# Patient Record
Sex: Female | Born: 1985 | Hispanic: No | Marital: Single | Smoking: Never smoker
Health system: Southern US, Community
[De-identification: ages and names within clinical notes are randomized; demographics above are authoritative.]

## PROBLEM LIST (undated history)

## (undated) ENCOUNTER — Inpatient Hospital Stay (HOSPITAL_COMMUNITY): Payer: Self-pay

## (undated) DIAGNOSIS — R87629 Unspecified abnormal cytological findings in specimens from vagina: Secondary | ICD-10-CM

## (undated) DIAGNOSIS — Z789 Other specified health status: Secondary | ICD-10-CM

## (undated) HISTORY — PX: WISDOM TOOTH EXTRACTION: SHX21

---
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
## 2006-09-25 ENCOUNTER — Ambulatory Visit: Payer: Self-pay | Admitting: Family Medicine

## 2006-09-26 ENCOUNTER — Ambulatory Visit: Payer: Self-pay | Admitting: Family Medicine

## 2006-12-19 ENCOUNTER — Ambulatory Visit: Payer: Self-pay | Admitting: Family Medicine

## 2007-03-29 ENCOUNTER — Ambulatory Visit: Payer: Self-pay | Admitting: Family Medicine

## 2007-03-30 ENCOUNTER — Ambulatory Visit: Payer: Self-pay | Admitting: Internal Medicine

## 2007-06-04 ENCOUNTER — Encounter: Payer: Self-pay | Admitting: Family Medicine

## 2007-06-04 DIAGNOSIS — E669 Obesity, unspecified: Secondary | ICD-10-CM

## 2007-06-21 ENCOUNTER — Telehealth (INDEPENDENT_AMBULATORY_CARE_PROVIDER_SITE_OTHER): Payer: Self-pay | Admitting: Family Medicine

## 2007-06-21 ENCOUNTER — Ambulatory Visit: Payer: Self-pay | Admitting: Family Medicine

## 2007-12-03 ENCOUNTER — Emergency Department (HOSPITAL_COMMUNITY): Admission: EM | Admit: 2007-12-03 | Discharge: 2007-12-03 | Payer: Self-pay | Admitting: Emergency Medicine

## 2008-06-10 ENCOUNTER — Other Ambulatory Visit: Admission: RE | Admit: 2008-06-10 | Discharge: 2008-06-10 | Payer: Self-pay | Admitting: Family Medicine

## 2008-06-10 ENCOUNTER — Ambulatory Visit: Payer: Self-pay | Admitting: Family Medicine

## 2008-06-10 ENCOUNTER — Encounter (INDEPENDENT_AMBULATORY_CARE_PROVIDER_SITE_OTHER): Payer: Self-pay | Admitting: Family Medicine

## 2008-06-10 LAB — CONVERTED CEMR LAB
Bilirubin Urine: NEGATIVE
Blood in Urine, dipstick: NEGATIVE
Glucose, Urine, Semiquant: NEGATIVE
Ketones, urine, test strip: NEGATIVE
Nitrite: NEGATIVE
Protein, U semiquant: NEGATIVE
Specific Gravity, Urine: >=1.03
Urobilinogen, UA: 4
WBC Urine, dipstick: NEGATIVE
pH: 5

## 2008-06-11 ENCOUNTER — Encounter (INDEPENDENT_AMBULATORY_CARE_PROVIDER_SITE_OTHER): Payer: Self-pay | Admitting: Family Medicine

## 2008-06-11 LAB — CONVERTED CEMR LAB
Chlamydia, DNA Probe: NEGATIVE
GC Probe Amp, Genital: NEGATIVE

## 2008-06-18 ENCOUNTER — Telehealth (INDEPENDENT_AMBULATORY_CARE_PROVIDER_SITE_OTHER): Payer: Self-pay | Admitting: *Deleted

## 2008-06-29 ENCOUNTER — Encounter (INDEPENDENT_AMBULATORY_CARE_PROVIDER_SITE_OTHER): Payer: Self-pay | Admitting: Family Medicine

## 2008-10-31 ENCOUNTER — Emergency Department (HOSPITAL_COMMUNITY): Admission: EM | Admit: 2008-10-31 | Discharge: 2008-10-31 | Payer: Self-pay | Admitting: Emergency Medicine

## 2008-11-13 ENCOUNTER — Emergency Department (HOSPITAL_COMMUNITY): Admission: EM | Admit: 2008-11-13 | Discharge: 2008-11-13 | Payer: Self-pay | Admitting: *Deleted

## 2009-06-07 ENCOUNTER — Inpatient Hospital Stay (HOSPITAL_COMMUNITY): Admission: AD | Admit: 2009-06-07 | Discharge: 2009-06-12 | Payer: Self-pay | Admitting: Obstetrics and Gynecology

## 2009-07-29 ENCOUNTER — Emergency Department (HOSPITAL_COMMUNITY): Admission: EM | Admit: 2009-07-29 | Discharge: 2009-07-30 | Payer: Self-pay | Admitting: Emergency Medicine

## 2009-10-28 ENCOUNTER — Emergency Department (HOSPITAL_COMMUNITY): Admission: EM | Admit: 2009-10-28 | Discharge: 2009-10-28 | Payer: Self-pay | Admitting: Family Medicine

## 2009-12-08 ENCOUNTER — Emergency Department (HOSPITAL_COMMUNITY): Admission: EM | Admit: 2009-12-08 | Discharge: 2009-12-08 | Payer: Self-pay | Admitting: Emergency Medicine

## 2009-12-22 ENCOUNTER — Emergency Department (HOSPITAL_COMMUNITY): Admission: EM | Admit: 2009-12-22 | Discharge: 2009-12-22 | Payer: Self-pay | Admitting: Emergency Medicine

## 2010-10-19 ENCOUNTER — Emergency Department (HOSPITAL_COMMUNITY)
Admission: EM | Admit: 2010-10-19 | Discharge: 2010-10-19 | Payer: Self-pay | Source: Home / Self Care | Admitting: Emergency Medicine

## 2010-10-25 LAB — CBC
HCT: 36.5 % (ref 36.0–46.0)
Hemoglobin: 12.6 g/dL (ref 12.0–15.0)
MCH: 31.2 pg (ref 26.0–34.0)
MCHC: 34.5 g/dL (ref 30.0–36.0)
MCV: 90.3 fL (ref 78.0–100.0)
Platelets: 257 10*3/uL (ref 150–400)
RBC: 4.04 MIL/uL (ref 3.87–5.11)
RDW: 13.7 % (ref 11.5–15.5)
WBC: 9 10*3/uL (ref 4.0–10.5)

## 2010-10-25 LAB — DIFFERENTIAL
Basophils Absolute: 0 10*3/uL (ref 0.0–0.1)
Basophils Relative: 0 % (ref 0–1)
Eosinophils Absolute: 0.1 10*3/uL (ref 0.0–0.7)
Eosinophils Relative: 1 % (ref 0–5)
Lymphocytes Relative: 20 % (ref 12–46)
Lymphs Abs: 1.8 10*3/uL (ref 0.7–4.0)
Monocytes Absolute: 0.5 10*3/uL (ref 0.1–1.0)
Monocytes Relative: 5 % (ref 3–12)
Neutro Abs: 6.7 10*3/uL (ref 1.7–7.7)
Neutrophils Relative %: 74 % (ref 43–77)

## 2010-10-25 LAB — COMPREHENSIVE METABOLIC PANEL
ALT: 11 U/L (ref 0–35)
AST: 15 U/L (ref 0–37)
Albumin: 3.4 g/dL — ABNORMAL LOW (ref 3.5–5.2)
Alkaline Phosphatase: 83 U/L (ref 39–117)
BUN: 3 mg/dL — ABNORMAL LOW (ref 6–23)
CO2: 23 mEq/L (ref 19–32)
Calcium: 9.2 mg/dL (ref 8.4–10.5)
Chloride: 105 mEq/L (ref 96–112)
Creatinine, Ser: 0.4 mg/dL (ref 0.4–1.2)
GFR calc Af Amer: >60 mL/min (ref >60)
GFR calc non Af Amer: >60 mL/min (ref >60)
Glucose, Bld: 107 mg/dL — ABNORMAL HIGH (ref 70–99)
Potassium: 3.3 mEq/L — ABNORMAL LOW (ref 3.5–5.1)
Sodium: 135 mEq/L (ref 135–145)
Total Bilirubin: 0.5 mg/dL (ref 0.3–1.2)
Total Protein: 6.9 g/dL (ref 6.0–8.3)

## 2010-10-25 LAB — URINE MICROSCOPIC-ADD ON

## 2010-10-25 LAB — HCG, QUANTITATIVE, PREGNANCY: hCG, Beta Chain, Quant, S: 32118 m[IU]/mL — ABNORMAL HIGH (ref <5)

## 2010-10-25 LAB — URINALYSIS, ROUTINE W REFLEX MICROSCOPIC
Bilirubin Urine: NEGATIVE
Hgb urine dipstick: NEGATIVE
Nitrite: NEGATIVE
Protein, ur: NEGATIVE mg/dL
Specific Gravity, Urine: 1.025 (ref 1.005–1.030)
Urine Glucose, Fasting: NEGATIVE mg/dL
Urobilinogen, UA: 1 mg/dL (ref 0.0–1.0)
pH: 6 (ref 5.0–8.0)

## 2010-10-25 LAB — POCT PREGNANCY, URINE: Preg Test, Ur: POSITIVE

## 2010-11-03 ENCOUNTER — Other Ambulatory Visit (HOSPITAL_COMMUNITY): Payer: Self-pay | Admitting: Obstetrics and Gynecology

## 2010-11-03 DIAGNOSIS — Z3689 Encounter for other specified antenatal screening: Secondary | ICD-10-CM

## 2010-12-01 ENCOUNTER — Ambulatory Visit (HOSPITAL_COMMUNITY)
Admission: RE | Admit: 2010-12-01 | Discharge: 2010-12-01 | Disposition: A | Discharge status: Home / Self Care | Payer: Medicaid Other | Source: Ambulatory Visit | Attending: Obstetrics and Gynecology | Admitting: Obstetrics and Gynecology

## 2010-12-01 DIAGNOSIS — Z1389 Encounter for screening for other disorder: Secondary | ICD-10-CM | POA: Insufficient documentation

## 2010-12-01 DIAGNOSIS — Z363 Encounter for antenatal screening for malformations: Secondary | ICD-10-CM | POA: Insufficient documentation

## 2010-12-01 DIAGNOSIS — O34219 Maternal care for unspecified type scar from previous cesarean delivery: Secondary | ICD-10-CM | POA: Insufficient documentation

## 2010-12-01 DIAGNOSIS — E669 Obesity, unspecified: Secondary | ICD-10-CM | POA: Insufficient documentation

## 2010-12-01 DIAGNOSIS — O358XX0 Maternal care for other (suspected) fetal abnormality and damage, not applicable or unspecified: Secondary | ICD-10-CM | POA: Insufficient documentation

## 2010-12-01 DIAGNOSIS — Z3689 Encounter for other specified antenatal screening: Secondary | ICD-10-CM

## 2010-12-10 ENCOUNTER — Inpatient Hospital Stay (HOSPITAL_COMMUNITY)
Admission: AD | Admit: 2010-12-10 | Discharge: 2010-12-11 | Disposition: A | Discharge status: Home / Self Care | Payer: Medicaid Other | Source: Ambulatory Visit | Attending: Obstetrics and Gynecology | Admitting: Obstetrics and Gynecology

## 2010-12-10 DIAGNOSIS — O212 Late vomiting of pregnancy: Secondary | ICD-10-CM | POA: Insufficient documentation

## 2010-12-10 LAB — URINALYSIS, ROUTINE W REFLEX MICROSCOPIC
Bilirubin Urine: NEGATIVE
Glucose, UA: NEGATIVE mg/dL
Hgb urine dipstick: NEGATIVE
Ketones, ur: >80 mg/dL — AB
Nitrite: NEGATIVE
Protein, ur: NEGATIVE mg/dL
Specific Gravity, Urine: >1.03 — ABNORMAL HIGH (ref 1.005–1.030)
Urobilinogen, UA: 1 mg/dL (ref 0.0–1.0)
pH: 6.5 (ref 5.0–8.0)

## 2010-12-26 LAB — POCT URINALYSIS DIP (DEVICE)
Bilirubin Urine: NEGATIVE
Glucose, UA: NEGATIVE mg/dL
Ketones, ur: NEGATIVE mg/dL
Nitrite: NEGATIVE
Protein, ur: 30 mg/dL — AB
Specific Gravity, Urine: 1.025 (ref 1.005–1.030)
Urobilinogen, UA: 1 mg/dL (ref 0.0–1.0)
pH: 6 (ref 5.0–8.0)

## 2010-12-26 LAB — POCT PREGNANCY, URINE: Preg Test, Ur: NEGATIVE

## 2011-01-02 LAB — COMPREHENSIVE METABOLIC PANEL
ALT: 17 U/L (ref 0–35)
AST: 20 U/L (ref 0–37)
Albumin: 4.2 g/dL (ref 3.5–5.2)
Alkaline Phosphatase: 78 U/L (ref 39–117)
BUN: 10 mg/dL (ref 6–23)
CO2: 22 mEq/L (ref 19–32)
Calcium: 9.2 mg/dL (ref 8.4–10.5)
Chloride: 99 mEq/L (ref 96–112)
Creatinine, Ser: 0.5 mg/dL (ref 0.4–1.2)
GFR calc Af Amer: >60 mL/min (ref >60)
GFR calc non Af Amer: >60 mL/min (ref >60)
Glucose, Bld: 113 mg/dL — ABNORMAL HIGH (ref 70–99)
Potassium: 3.9 mEq/L (ref 3.5–5.1)
Sodium: 134 mEq/L — ABNORMAL LOW (ref 135–145)
Total Bilirubin: 0.5 mg/dL (ref 0.3–1.2)
Total Protein: 8 g/dL (ref 6.0–8.3)

## 2011-01-02 LAB — GC/CHLAMYDIA PROBE AMP, GENITAL
Chlamydia, DNA Probe: NEGATIVE
GC Probe Amp, Genital: NEGATIVE

## 2011-01-02 LAB — DIFFERENTIAL
Basophils Absolute: 0 10*3/uL (ref 0.0–0.1)
Basophils Relative: 0 % (ref 0–1)
Eosinophils Absolute: 0 10*3/uL (ref 0.0–0.7)
Eosinophils Relative: 0 % (ref 0–5)
Lymphocytes Relative: 13 % (ref 12–46)
Lymphs Abs: 1.5 10*3/uL (ref 0.7–4.0)
Monocytes Absolute: 0.4 10*3/uL (ref 0.1–1.0)
Monocytes Relative: 3 % (ref 3–12)
Neutro Abs: 9.3 10*3/uL — ABNORMAL HIGH (ref 1.7–7.7)
Neutrophils Relative %: 83 % — ABNORMAL HIGH (ref 43–77)

## 2011-01-02 LAB — URINE MICROSCOPIC-ADD ON

## 2011-01-02 LAB — URINALYSIS, ROUTINE W REFLEX MICROSCOPIC
Bilirubin Urine: NEGATIVE
Bilirubin Urine: NEGATIVE
Glucose, UA: NEGATIVE mg/dL
Glucose, UA: NEGATIVE mg/dL
Hgb urine dipstick: NEGATIVE
Ketones, ur: NEGATIVE mg/dL
Ketones, ur: NEGATIVE mg/dL
Nitrite: NEGATIVE
Nitrite: NEGATIVE
Protein, ur: NEGATIVE mg/dL
Specific Gravity, Urine: 1.011 (ref 1.005–1.030)
Specific Gravity, Urine: 1.02 (ref 1.005–1.030)
Urobilinogen, UA: 0.2 mg/dL (ref 0.0–1.0)
pH: 7.5 (ref 5.0–8.0)
pH: 8 (ref 5.0–8.0)

## 2011-01-02 LAB — POCT PREGNANCY, URINE
Preg Test, Ur: NEGATIVE
Preg Test, Ur: NEGATIVE

## 2011-01-02 LAB — CBC
HCT: 39.7 % (ref 36.0–46.0)
Hemoglobin: 13.4 g/dL (ref 12.0–15.0)
MCHC: 33.7 g/dL (ref 30.0–36.0)
MCV: 87 fL (ref 78.0–100.0)
Platelets: 377 10*3/uL (ref 150–400)
RBC: 4.57 MIL/uL (ref 3.87–5.11)
RDW: 16.5 % — ABNORMAL HIGH (ref 11.5–15.5)
WBC: 11.2 10*3/uL — ABNORMAL HIGH (ref 4.0–10.5)

## 2011-01-02 LAB — LIPASE, BLOOD: Lipase: 17 U/L (ref 11–59)

## 2011-01-02 LAB — URINE CULTURE: Colony Count: >=100000

## 2011-01-02 LAB — WET PREP, GENITAL
Trich, Wet Prep: NONE SEEN
Yeast Wet Prep HPF POC: NONE SEEN

## 2011-01-13 LAB — URINALYSIS, ROUTINE W REFLEX MICROSCOPIC
Glucose, UA: NEGATIVE mg/dL
Nitrite: NEGATIVE
Specific Gravity, Urine: 1.013 (ref 1.005–1.030)
pH: 6 (ref 5.0–8.0)

## 2011-01-13 LAB — URINE MICROSCOPIC-ADD ON

## 2011-01-14 LAB — CBC
HCT: 25.7 % — ABNORMAL LOW (ref 36.0–46.0)
MCHC: 34.2 g/dL (ref 30.0–36.0)
MCV: 90.8 fL (ref 78.0–100.0)
Platelets: 212 10*3/uL (ref 150–400)
RDW: 14 % (ref 11.5–15.5)

## 2011-01-15 LAB — CBC
Platelets: 245 10*3/uL (ref 150–400)
RDW: 13.4 % (ref 11.5–15.5)
WBC: 10.9 10*3/uL — ABNORMAL HIGH (ref 4.0–10.5)

## 2011-01-15 LAB — RPR: RPR Ser Ql: NONREACTIVE

## 2011-01-25 LAB — CBC
MCHC: 34.8 g/dL (ref 30.0–36.0)
RBC: 3.71 MIL/uL — ABNORMAL LOW (ref 3.87–5.11)
WBC: 12.5 10*3/uL — ABNORMAL HIGH (ref 4.0–10.5)

## 2011-01-25 LAB — DIFFERENTIAL
Basophils Relative: 1 % (ref 0–1)
Lymphocytes Relative: 11 % — ABNORMAL LOW (ref 12–46)
Lymphs Abs: 1.4 10*3/uL (ref 0.7–4.0)
Monocytes Relative: 7 % (ref 3–12)
Neutro Abs: 10 10*3/uL — ABNORMAL HIGH (ref 1.7–7.7)
Neutrophils Relative %: 80 % — ABNORMAL HIGH (ref 43–77)

## 2011-01-25 LAB — URINALYSIS, ROUTINE W REFLEX MICROSCOPIC
Glucose, UA: NEGATIVE mg/dL
Protein, ur: 100 mg/dL — AB
Specific Gravity, Urine: 1.019 (ref 1.005–1.030)
Urobilinogen, UA: 1 mg/dL (ref 0.0–1.0)

## 2011-01-25 LAB — POCT I-STAT, CHEM 8
Hemoglobin: 11.6 g/dL — ABNORMAL LOW (ref 12.0–15.0)
Sodium: 135 mEq/L (ref 135–145)
TCO2: 21 mmol/L (ref 0–100)

## 2011-01-25 LAB — URINE CULTURE: Colony Count: >=100000

## 2011-01-25 LAB — URINE MICROSCOPIC-ADD ON

## 2011-01-30 ENCOUNTER — Inpatient Hospital Stay (HOSPITAL_COMMUNITY)
Admission: AD | Admit: 2011-01-30 | Discharge: 2011-01-30 | Disposition: A | Discharge status: Home / Self Care | Payer: Medicaid Other | Source: Ambulatory Visit | Attending: Obstetrics and Gynecology | Admitting: Obstetrics and Gynecology

## 2011-01-30 ENCOUNTER — Inpatient Hospital Stay (HOSPITAL_COMMUNITY)
Admission: AD | Admit: 2011-01-30 | Discharge: 2011-02-02 | DRG: 775 | Disposition: A | Discharge status: Home / Self Care | Payer: Medicaid Other | Source: Ambulatory Visit | Attending: Obstetrics and Gynecology | Admitting: Obstetrics and Gynecology

## 2011-01-30 DIAGNOSIS — O34219 Maternal care for unspecified type scar from previous cesarean delivery: Principal | ICD-10-CM | POA: Diagnosis present

## 2011-01-30 DIAGNOSIS — Z22330 Carrier of Group B streptococcus: Secondary | ICD-10-CM

## 2011-01-30 DIAGNOSIS — O479 False labor, unspecified: Secondary | ICD-10-CM | POA: Insufficient documentation

## 2011-01-30 DIAGNOSIS — O99892 Other specified diseases and conditions complicating childbirth: Secondary | ICD-10-CM | POA: Diagnosis present

## 2011-01-30 LAB — CBC
Platelets: 204 10*3/uL (ref 150–400)
RBC: 4.62 MIL/uL (ref 3.87–5.11)
WBC: 13.5 10*3/uL — ABNORMAL HIGH (ref 4.0–10.5)

## 2011-01-30 LAB — RPR: RPR Ser Ql: NONREACTIVE

## 2011-01-31 LAB — CBC
Platelets: 179 10*3/uL (ref 150–400)
RBC: 3.68 MIL/uL — ABNORMAL LOW (ref 3.87–5.11)
WBC: 13.4 10*3/uL — ABNORMAL HIGH (ref 4.0–10.5)

## 2011-02-11 NOTE — Discharge Summary (Signed)
[name], [name]               ACCOUNT NO.:  [id number]  MEDICAL RECORD NO.:  [id number]           PATIENT TYPE:  I  LOCATION:  9126                          FACILITY:  [facility]  PHYSICIAN:  Malachi Pro. Ambrose Mantle, M.D. DATE OF BIRTH:  1986/10/10  DATE OF ADMISSION:  01/30/2011 DATE OF DISCHARGE:  02/01/2011                              DISCHARGE SUMMARY   This is a 25 year old black female para 1-0-0-1 gravida 2, EDC February 04, 2011, admitted in active labor.  Blood group and type O positive, negative antibody, nonreactive serology, rubella immune.  Urine culture negative.  Hepatitis B surface antigen negative.  HIV negative, GC and Chlamydia negative.  1-hour Glucola 106.  Group B strep was positive. By the last period, estimated date of delivery was February 04, 2011.  She presented late for care and ultrasound on November 02, 2010, showed an estimated gestational age of [redacted] weeks and 2 days with an [facility] of February 06, 2011.  The patient requested vaginal birth after cesarean.  She was in the Maternity Admission Unit on morning of admission with contractions, but her cervix did not change.  She came back and the cervix was 7-8 cm dilated.  PAST MEDICAL HISTORY:  No known allergies.  OPERATION:  C-section, arrest of dilatation at 5 cm.  ILLNESSES:  None.  ALCOHOL, TOBACCO, AND DRUGS:  None.  FAMILY HISTORY:  Maternal grandfather and maternal grandmother with diabetes.  OBSTETRICAL HISTORY:  On June 09, 2009, she had a C-section with a 7 pounds 8 ounce female infant for arrest of dilatation.  PHYSICAL EXAM ON ADMISSION:  VITAL SIGNS:  Normal. HEART/LUNGS:  Normal. ABDOMEN:  Soft.  Fundal height had been 36 cm on January 10, 2011.  Fetal heart tones were normal.  Contractions every 1-1/2 to 2 minutes.  Cervix was 9 cm, 100% with a bulging bag of waters.  Vertex was at -4.  The patient requested an epidural because it could be a short time to delivery.  She was begun on intravenous  ampicillin to cover the group B strep and she requested VBAC.  She received her epidural at 3:36 p.m., artificial rupture of membranes produced light meconium-stained fluid. The cervix was 8-9 cm, 100% vertex, at -2.  4 p.m. the cervix was unchanged, 5:10 p.m. the cervix was unchanged, but the vertex had descended to a 0 station.  By 6:17 p.m., the cervix was fully dilated, vertex was LOA at a +1 station.  She began pushing.  She made slow, but steady progress and delivered spontaneously OA over bilateral labial with a second-degree midline laceration by Dr. Ambrose Mantle a living female infant 7 pounds 6 ounces, Apgars of 8 at one minute and 9 at five minutes.  Placenta was intact.  C-section scar was intact.  Uterus was normal.  Lacerations were repaired with 2-0 and 3-0 Vicryl.  Blood loss was about 500 mL.  Postpartum, the patient did well and was discharged on the second postpartum day.  Laboratory data showed a blood group and type O+.  RPR nonreactive. Initial hemoglobin 13.5, hematocrit 40.5, white count 13,500, platelet count 204,000.  Followup  hemoglobin 10.7.  FINAL DIAGNOSES:  Intrauterine pregnancy at 39+ weeks, prior C-section, positive group B strep, delivered occiput anterior vaginally.  OPERATION:  Spontaneous delivery occiput anterior, repair of bilateral labial and second-degree midline laceration.  FINAL CONDITION:  Improved.  Instructions include our regular discharge instruction booklet.  The patient is advised to continue her prenatal vitamins, take Benadryl as needed, Motrin 600 mg 30 tablets one every 6 hours as needed for pain, and she is advised to return in 6 weeks for followup examination. She is considering the Mirena.  She is advised to call the office as to check her benefits and either abstain from intercourse prior to the 6 weeks checkup or use a double protection of condoms with each episode of intercourse.     Malachi Pro. Ambrose Mantle,  M.D.     TFH/MEDQ  D:  02/01/2011  T:  02/01/2011  Job:  [id number]  Electronically Signed by Tracey Harries M.D. on 02/11/2011 09:13:25 AM

## 2011-02-22 NOTE — H&P (Signed)
[name], [name]               ACCOUNT NO.:  [id number]   MEDICAL RECORD NO.:  [id number]          PATIENT TYPE:  MAT   LOCATION:  DFTL                          [facility]:  [facility]   PHYSICIAN:  Zenaida Niece, M.D.DATE OF BIRTH:  08-29-1986   DATE OF ADMISSION:  06/07/2009  DATE OF DISCHARGE:                              HISTORY & PHYSICAL   CHIEF COMPLAINT:  Intrauterine pregnancy at 40 plus weeks for cervical  ripening and induction.   HISTORY OF PRESENT ILLNESS:  This is a 25 year old female gravida 1,  para 0 with an EGA of 40+ weeks by an LMP consistent with a 9-week  ultrasound with a due date of June 03, 2009, who is being admitted for  cervical ripening induction due to going past her due date.  Prenatal  care has been essentially uncomplicated.  Prenatal labs, RPR  nonreactive, hepatitis B surface antigen negative, rubella immune, HIV  negative, blood type is O+ with negative antibody screen.  Gonorrhea and  chlamydia negative.  Cystic fibrosis negative.  First trimester screen  is normal.  MSAFP is normal.  One-hour Glucola is 93.  Group B strep is  positive.   PAST MEDICAL HISTORY:  Negative.   PAST SURGICAL HISTORY:  Is negative.   ALLERGIES:  None known.   CURRENT MEDICATIONS:  None.   REVIEW OF SYSTEMS:  Normal complaints of pregnancy.   FAMILY HISTORY:  Noncontributory.   PHYSICAL EXAMINATION:  GENERAL:  This is a well-developed gravid female  in no acute distress, weight is 226 pounds, blood pressure was 130/70.  LUNGS:  Clear to auscultation.  HEART:  Regular rate and rhythm without murmur.  ABDOMEN:  Gravid, nontender with a fundal height of 39 cm and an  estimated fetal weight of 8 pounds.  Cervix is fingertip, 80 and -3 with a vertex presentation.   ASSESSMENT:  1. Intrauterine pregnancy at 40+ weeks, being admitted for cervical      ripening and induction.  Plan is to admit the patient for ripening      with Cytotec followed by induction with  Pitocin.  2. Group B strep positive.  IV penicillin and labor.     Zenaida Niece, M.D.  Electronically Signed    TDM/MEDQ  D:  06/05/2009  T:  06/06/2009  Job:  [id number]

## 2011-02-22 NOTE — Op Note (Signed)
NAMEJENEVIEVE, [name]               ACCOUNT NO.:  [id number]   MEDICAL RECORD NO.:  [id number]          PATIENT TYPE:  INP   LOCATION:  9134                          [facility]ILITY:  [facility]   PHYSICIAN:  Leighton Roach Meisinger, M.D.DATE OF BIRTH:  Jul 22, 1986   DATE OF PROCEDURE:  06/09/2009  DATE OF DISCHARGE:                               OPERATIVE REPORT   PREOPERATIVE DIAGNOSES:  Intrauterine pregnancy at 40+ weeks, arrest of  dilation/failed induction, and meconium-stained amniotic fluid.   POSTOPERATIVE DIAGNOSES:  Intrauterine pregnancy at 40+ weeks, arrest of  dilation/failed induction, and meconium-stained amniotic fluid.   PROCEDURE:  Primary low-transverse cesarean section without extensions.   SURGEON:  Zenaida Niece, MD   ANESTHESIA:  Epidural.   FINDINGS:  The patient had normal gravid anatomy and delivered a viable  female infant with Apgars of 8 and 9 that weighed 7 pounds 8 ounces.   SPECIMENS:  Placenta sent for cord blood donation.   ESTIMATED BLOOD LOSS:  800 mL.   COMPLICATIONS:  None.   PROCEDURE IN DETAIL:  The patient was taken to the operating room and  placed in the dorsal supine position with left lateral tilt.  Her  previously placed epidural was dosed appropriately.  Abdomen was prepped  and draped in the usual sterile fashion.  A Foley catheter had  previously been placed.  The level of her anesthesia was found to be  adequate and abdomen was entered via a standard Pfannenstiel incision.  Once the peritoneal cavity was entered, the [employer] disposable self-  retaining retractor was placed and the lower uterine segment was well  exposed.  A 4-cm transverse incision was then made in the lower uterine  segment pushing the bladder inferior.  Once the uterine cavity was  entered, the incision was extended digitally.  The fetal vertex was then  grasped and delivered through the incision atraumatically.  Mouth and  nares were suctioned.  There was noted to be a  very superficial  laceration on the baby's right cheek.  The remainder of the infant then  delivered atraumatically.  Cord was doubly clamped and cut and infant  handed to the awaiting pediatric team.  Placenta delivered spontaneous  and was sent for cord blood donation.  Uterus was wiped dry with a clean  lap pad, all clots and debris removed.  The uterus was initially boggy,  but responded to massage and IV Pitocin.  Uterine incision was inspected  and found to be free of extensions.  The uterine incision was closed in  2 layers with the first layer being running locking layer with #1  chromic and the second layer being an imbricating layer also with #1  chromic.  Three areas of bleeding were controlled with figure-of-eight  sutures of #1 chromic.  Bleeding from serosal edges was controlled with  electrocautery.  Tubes and ovaries were inspected and found to be  normal.  Blood was removed from both paracolic gutters.  Uterine  incision was again inspected and found to be hemostatic.  The Alexis  retractor was removed.  The subfascial space was irrigated and  made  hemostatic with electrocautery.  Fascia was closed in running fashion  starting at both ends and meeting in the middle with 0 Vicryl.  Subcutaneous tissue was then irrigated and made hemostatic with  electrocautery.  Subcutaneous tissue was then closed with running 2-0  plain gut suture.  Skin was closed with staples followed by a sterile  dressing.  The patient tolerated the procedure well and was taken to the  recovery room in stable condition.  Counts were correct, she received  Ancef 1 g IV at the beginning of the procedure and had PAS hose on  throughout procedure.      Zenaida Niece, M.D.  Electronically Signed     TDM/MEDQ  D:  06/09/2009  T:  06/09/2009  Job:  [id number]

## 2011-05-24 ENCOUNTER — Emergency Department (HOSPITAL_COMMUNITY)
Admission: EM | Admit: 2011-05-24 | Discharge: 2011-05-24 | Disposition: A | Discharge status: Home / Self Care | Payer: Medicaid Other | Attending: Emergency Medicine | Admitting: Emergency Medicine

## 2011-05-24 ENCOUNTER — Emergency Department (HOSPITAL_COMMUNITY): Payer: Medicaid Other

## 2011-05-24 DIAGNOSIS — R112 Nausea with vomiting, unspecified: Secondary | ICD-10-CM | POA: Insufficient documentation

## 2011-05-24 DIAGNOSIS — R197 Diarrhea, unspecified: Secondary | ICD-10-CM | POA: Insufficient documentation

## 2011-05-24 DIAGNOSIS — K8020 Calculus of gallbladder without cholecystitis without obstruction: Secondary | ICD-10-CM | POA: Insufficient documentation

## 2011-05-24 DIAGNOSIS — E669 Obesity, unspecified: Secondary | ICD-10-CM | POA: Insufficient documentation

## 2011-05-24 DIAGNOSIS — M549 Dorsalgia, unspecified: Secondary | ICD-10-CM | POA: Insufficient documentation

## 2011-05-24 DIAGNOSIS — R109 Unspecified abdominal pain: Secondary | ICD-10-CM | POA: Insufficient documentation

## 2011-05-24 LAB — COMPREHENSIVE METABOLIC PANEL
AST: 17 U/L (ref 0–37)
Albumin: 4.3 g/dL (ref 3.5–5.2)
BUN: 14 mg/dL (ref 6–23)
CO2: 27 mEq/L (ref 19–32)
Calcium: 9.5 mg/dL (ref 8.4–10.5)
Creatinine, Ser: <0.47 mg/dL — ABNORMAL LOW (ref 0.50–1.10)
Total Bilirubin: 0.3 mg/dL (ref 0.3–1.2)

## 2011-05-24 LAB — CBC
HCT: 38.5 % (ref 36.0–46.0)
Hemoglobin: 13.6 g/dL (ref 12.0–15.0)
RBC: 4.33 MIL/uL (ref 3.87–5.11)
WBC: 10.1 10*3/uL (ref 4.0–10.5)

## 2011-05-24 LAB — DIFFERENTIAL
Basophils Absolute: 0 10*3/uL (ref 0.0–0.1)
Lymphocytes Relative: 18 % (ref 12–46)
Monocytes Absolute: 0.5 10*3/uL (ref 0.1–1.0)
Neutro Abs: 7.7 10*3/uL (ref 1.7–7.7)
Neutrophils Relative %: 77 % (ref 43–77)

## 2011-05-24 LAB — URINE MICROSCOPIC-ADD ON

## 2011-05-24 LAB — URINALYSIS, ROUTINE W REFLEX MICROSCOPIC
Glucose, UA: NEGATIVE mg/dL
Leukocytes, UA: NEGATIVE
Specific Gravity, Urine: 1.023 (ref 1.005–1.030)

## 2011-05-24 LAB — LIPASE, BLOOD: Lipase: 22 U/L (ref 11–59)

## 2011-05-24 LAB — POCT PREGNANCY, URINE: Preg Test, Ur: NEGATIVE

## 2011-07-04 LAB — POCT I-STAT CREATININE: Operator id: 133351

## 2011-07-04 LAB — DIFFERENTIAL
Eosinophils Absolute: 0.1
Eosinophils Relative: 1
Lymphocytes Relative: 18
Lymphs Abs: 2.3
Monocytes Absolute: 0.8
Monocytes Relative: 6

## 2011-07-04 LAB — URINALYSIS, ROUTINE W REFLEX MICROSCOPIC
Glucose, UA: NEGATIVE
Ketones, ur: NEGATIVE
Leukocytes, UA: NEGATIVE
Nitrite: NEGATIVE
Protein, ur: NEGATIVE

## 2011-07-04 LAB — CBC
HCT: 39
Hemoglobin: 13.2
MCV: 89.1
Platelets: 387
RBC: 4.38
WBC: 12.6 — ABNORMAL HIGH

## 2011-07-04 LAB — GC/CHLAMYDIA PROBE AMP, GENITAL: GC Probe Amp, Genital: NEGATIVE

## 2011-07-04 LAB — I-STAT 8, (EC8 V) (CONVERTED LAB)
BUN: 10
Chloride: 105
Glucose, Bld: 119 — ABNORMAL HIGH
Hemoglobin: 14.6
Potassium: 3.7
Sodium: 138
TCO2: 24
pH, Ven: 7.313 — ABNORMAL HIGH

## 2011-07-04 LAB — HCG, SERUM, QUALITATIVE: Preg, Serum: NEGATIVE

## 2011-07-04 LAB — URINE MICROSCOPIC-ADD ON

## 2011-07-04 LAB — POCT PREGNANCY, URINE: Operator id: 272551

## 2012-04-30 ENCOUNTER — Encounter (HOSPITAL_COMMUNITY): Payer: Self-pay | Admitting: *Deleted

## 2012-04-30 ENCOUNTER — Emergency Department (HOSPITAL_COMMUNITY)
Admission: EM | Admit: 2012-04-30 | Discharge: 2012-04-30 | Disposition: A | Discharge status: Home / Self Care | Payer: Medicaid Other | Attending: Emergency Medicine | Admitting: Emergency Medicine

## 2012-04-30 ENCOUNTER — Emergency Department (HOSPITAL_COMMUNITY): Payer: Medicaid Other

## 2012-04-30 DIAGNOSIS — M25519 Pain in unspecified shoulder: Secondary | ICD-10-CM | POA: Insufficient documentation

## 2012-04-30 MED ORDER — IBUPROFEN 800 MG PO TABS: 800.0000 mg | ORAL_TABLET | Freq: Three times a day (TID) | ORAL | Status: AC

## 2012-04-30 MED ORDER — IBUPROFEN 400 MG PO TABS
800.0000 mg | ORAL_TABLET | Freq: Once | ORAL | Status: AC
  Administered 2012-04-30: 800 mg via ORAL
  Filled 2012-04-30: qty 2

## 2012-04-30 NOTE — ED Notes (Signed)
Patient transported to X-ray 

## 2012-04-30 NOTE — ED Provider Notes (Signed)
History     CSN: [id number]  Arrival date & time 04/30/12  2113   First MD Initiated Contact with Patient 04/30/12 2203      Chief Complaint  Patient presents with  . Shoulder Pain    (Consider location/radiation/quality/duration/timing/severity/associated sxs/prior treatment) HPI Comments: Patient with a significant past medical history presents emergency department chief complaint right pain.  Patient issues during her daughter up the stairs this afternoon when her right shoulder began to bother her.  She denies any numbness, tingling, or weakness of the extremity recent trauma or fevers, night sweats, chills.  Patient states that it hurts when she tries to lift her arm over her head.  No other complaints this time.  Patient is a 26 y.o. female presenting with shoulder pain. The history is provided by the patient.  Shoulder Pain    History reviewed. No pertinent past medical history.  Past Surgical History  Procedure Date  . Cesarean section     History reviewed. No pertinent family history.  History  Substance Use Topics  . Smoking status: Never Smoker   . Smokeless tobacco: Not on file  . Alcohol Use: No    OB History    Grav Para Term Preterm Abortions TAB SAB Ect Mult Living                  Review of Systems  All other systems reviewed and are negative.    Allergies  Review of patient's allergies indicates no known allergies.  Home Medications  No current outpatient prescriptions on file.  BP 110/77  Pulse 64  Temp 98.4 F (36.9 C) (Oral)  Resp 18  SpO2 100%  LMP 04/09/2012  Physical Exam  Nursing note and vitals reviewed. Constitutional: She is oriented to person, place, and time. She appears well-developed and well-nourished. No distress.  HENT:  Head: Normocephalic and atraumatic.  Eyes: Conjunctivae and EOM are normal.  Neck: Normal range of motion.       No spinous process tenderness, step offs, or nuchal rigidity.  Pulmonary/Chest:  Effort normal.  Musculoskeletal: Normal range of motion.       No bony tenderness to palpation.  Pain with active abduction but no pain with passive range of motion or adduction of right shoulder.  No muscular tenderness to palpation.  Neurological: She is alert and oriented to person, place, and time.       Strength 5/5 bilaterally  Skin: Skin is warm and dry. No rash noted. She is not diaphoretic.  Psychiatric: She has a normal mood and affect. Her behavior is normal.    ED Course  Procedures (including critical care time)  Labs Reviewed - No data to display Dg Shoulder Right  04/30/2012  *RADIOLOGY REPORT*  Clinical Data: Right shoulder pain after lifting injury.  RIGHT SHOULDER - 2+ VIEW  Comparison: None.  Findings: No acute fracture or dislocation identified.  There are mild proliferative changes involving the acromion.  The [facility] joint shows normal alignment.  IMPRESSION: No acute fracture.  Original Report Authenticated By: Reola Calkins, M.D.     No diagnosis found.    MDM  Shoulder pain  Patient X-Ray negative for obvious fracture or dislocation. Pain managed in ED. Pt advised to follow up with orthopedics if symptoms persist. Patient not given brace while in ED, conservative therapy recommended and discussed. Patient will be dc home & is agreeable with above plan.         Jaci Carrel, [place] 04/30/12 2316

## 2012-04-30 NOTE — ED Notes (Signed)
Pt states she was carrying daughter up stairs this afternoon, since then has had right shoulder pain.  Denies n/v, diaphoresis, SOB.

## 2012-05-02 NOTE — ED Provider Notes (Signed)
Medical screening examination/treatment/procedure(s) were performed by non-physician practitioner and as supervising physician I was immediately available for consultation/collaboration.   Carleene Cooper III, MD 05/02/12 [phone]

## 2016-01-07 ENCOUNTER — Emergency Department (HOSPITAL_COMMUNITY)
Admission: EM | Admit: 2016-01-07 | Discharge: 2016-01-07 | Disposition: A | Discharge status: Home / Self Care | Payer: Medicaid Other | Attending: Emergency Medicine | Admitting: Emergency Medicine

## 2016-01-07 ENCOUNTER — Encounter (HOSPITAL_COMMUNITY): Payer: Self-pay | Admitting: Emergency Medicine

## 2016-01-07 ENCOUNTER — Emergency Department (HOSPITAL_COMMUNITY): Payer: Medicaid Other

## 2016-01-07 DIAGNOSIS — S6992XA Unspecified injury of left wrist, hand and finger(s), initial encounter: Secondary | ICD-10-CM | POA: Diagnosis not present

## 2016-01-07 DIAGNOSIS — W01198A Fall on same level from slipping, tripping and stumbling with subsequent striking against other object, initial encounter: Secondary | ICD-10-CM | POA: Insufficient documentation

## 2016-01-07 DIAGNOSIS — Y9389 Activity, other specified: Secondary | ICD-10-CM | POA: Diagnosis not present

## 2016-01-07 DIAGNOSIS — Y998 Other external cause status: Secondary | ICD-10-CM | POA: Insufficient documentation

## 2016-01-07 DIAGNOSIS — M79645 Pain in left finger(s): Secondary | ICD-10-CM

## 2016-01-07 DIAGNOSIS — Y9289 Other specified places as the place of occurrence of the external cause: Secondary | ICD-10-CM | POA: Insufficient documentation

## 2016-01-07 MED ORDER — NAPROXEN 500 MG PO TABS: 500.0000 mg | ORAL_TABLET | Freq: Two times a day (BID) | ORAL | Status: DC

## 2016-01-07 NOTE — Discharge Instructions (Signed)
Finger Sprain A finger sprain is a tear in one of the strong, fibrous tissues that connect the bones (ligaments) in your finger. The severity of the sprain depends on how much of the ligament is torn. The tear can be either partial or complete. CAUSES  Often, sprains are a result of a fall or accident. If you extend your hands to catch an object or to protect yourself, the force of the impact causes the fibers of your ligament to stretch too much. This excess tension causes the fibers of your ligament to tear. SYMPTOMS  You may have some loss of motion in your finger. Other symptoms include:  Bruising.  Tenderness.  Swelling. DIAGNOSIS  In order to diagnose finger sprain, your caregiver will physically examine your finger or thumb to determine how torn the ligament is. Your caregiver may also suggest an X-ray exam of your finger to make sure no bones are broken. TREATMENT  If your ligament is only partially torn, treatment usually involves keeping the finger in a fixed position (immobilization) for a short period. To do this, your caregiver will apply a bandage, cast, or splint to keep your finger from moving until it heals. For a partially torn ligament, the healing process usually takes 2 to 3 weeks. If your ligament is completely torn, you may need surgery to reconnect the ligament to the bone. After surgery a cast or splint will be applied and will need to stay on your finger or thumb for 4 to 6 weeks while your ligament heals. HOME CARE INSTRUCTIONS  Keep your injured finger elevated, when possible, to decrease swelling.  To ease pain and swelling, apply ice to your joint twice a day, for 2 to 3 days:  Put ice in a plastic bag.  Place a towel between your skin and the bag.  Leave the ice on for 15 minutes.  Only take over-the-counter or prescription medicine for pain as directed by your caregiver.  Do not wear rings on your injured finger.  Do not leave your finger unprotected  until pain and stiffness go away (usually 3 to 4 weeks).  Do not allow your cast or splint to get wet. Cover your cast or splint with a plastic bag when you shower or bathe. Do not swim.  Your caregiver may suggest special exercises for you to do during your recovery to prevent or limit permanent stiffness. SEEK IMMEDIATE MEDICAL CARE IF:  Your cast or splint becomes damaged.  Your pain becomes worse rather than better. MAKE SURE YOU:  Understand these instructions.  Will watch your condition.  Will get help right away if you are not doing well or get worse.   This information is not intended to replace advice given to you by your health care provider. Make sure you discuss any questions you have with your health care provider.   Document Released: 11/03/2004 Document Revised: 10/17/2014 Document Reviewed: 05/30/2011 Elsevier Interactive Patient Education 2016 [employer]  

## 2016-01-07 NOTE — ED Provider Notes (Signed)
CSN: [id number]     Arrival date & time 01/07/16  1914 History  By signing my name below, I, [name], attest that this documentation has been prepared under the direction and in the presence of Caroline Morn, NP. Electronically Signed: Tanda Hodges, ED Scribe. 01/07/2016. 7:32 PM.   Chief Complaint  Patient presents with  . Finger Injury   The history is provided by the patient. No language interpreter was used.     HPI Comments: [name] is a 30 y.o. female who is right hand dominant, presents to the Emergency Department complaining of sudden onset, constant, left index finger pain s/p ground level fall that occurred earlier today. Pt reports that she tripped and fell and must have hit her finger on something in the process but is unsure what she hit it on. No head injury or LOC. Pt also complains of swelling to the finger. She states she is unable to bend the finger due to the swelling. Denies weakness, numbness, tingling, left wrist pain, left elbow pain, left shoulder pain, or any other associated symptoms.   History reviewed. No pertinent past medical history. Past Surgical History  Procedure Laterality Date  . Cesarean section     No family history on file. Social History  Substance Use Topics  . Smoking status: Never Smoker   . Smokeless tobacco: None  . Alcohol Use: No   OB History    No data available     Review of Systems  Constitutional: Negative for fever.  Musculoskeletal: Positive for joint swelling and arthralgias (left index finger).  Neurological: Negative for syncope, weakness and numbness.  All other systems reviewed and are negative.     Allergies  Review of patient's allergies indicates no known allergies.  Home Medications   Prior to Admission medications   Not on File   BP 142/70 mmHg  Pulse 80  Temp(Src) 98.4 F (36.9 C) (Oral)  Resp 16  Ht  (1.575 m)  Wt 251 lb (113.853 kg)  BMI 45.90 kg/m2  SpO2 100%  LMP 01/04/2016  (Approximate)   Physical Exam  Constitutional: She is oriented to person, place, and time. She appears well-developed and well-nourished. No distress.  HENT:  Head: Normocephalic and atraumatic.  Eyes: Conjunctivae and EOM are normal.  Neck: Neck supple. No tracheal deviation present.  Cardiovascular: Normal rate.   Pulmonary/Chest: Effort normal. No respiratory distress.  Musculoskeletal: She exhibits edema and tenderness.  Swelling and tenderness over the PIP joint of the left index finger with limited ROM due to pain.  Neurological: She is alert and oriented to person, place, and time.  Skin: Skin is warm and dry.  Psychiatric: She has a normal mood and affect. Her behavior is normal.  Nursing note and vitals reviewed.   ED Course  Procedures (including critical care time)  DIAGNOSTIC STUDIES: Oxygen Saturation is 100% on room air normal by my interpretation.    COORDINATION OF CARE: 7:30 PM-Discussed treatment plan which includes DG L Hand with pt at bedside and pt agreed to plan.   Labs Review Labs Reviewed - No data to display  Imaging Review Dg Finger Index Left  01/07/2016  CLINICAL DATA:  Fall. Pt reports carrying a bag of groceries when she tripped and bumped into her left index finger. Pain over PIP joint. Bruising with some swelling noted. EXAM: LEFT INDEX FINGER 2+V COMPARISON:  None. FINDINGS: There is no evidence of fracture or dislocation. There is no evidence of arthropathy or  other focal bone abnormality. Soft tissues are unremarkable. IMPRESSION: Negative. Electronically Signed   By: Amie Portlandavid  Ormond M.D.   On: 01/07/2016 20:17   I have personally reviewed and evaluated these images as part of my medical decision-making.   EKG Interpretation None     Radiology results reviewed and shared with patient. MDM   Final diagnoses:  None  left index finger pain.   Patient X-Ray negative for obvious fracture or dislocation.  Pt advised to follow up with  orthopedics. Patient given finger solint while in ED, conservative therapy recommended and discussed. Patient will be discharged home & is agreeable with above plan. Returns precautions discussed. Pt appears safe for discharge.  I personally performed the services described in this documentation, which was scribed in my presence. The recorded information has been reviewed and is accurate.      Caroline Mornavid Airyanna Dipalma, NP 01/07/16 2154  Caroline Spatesachel Morgan Little, MD 01/08/16 [phone]

## 2016-01-07 NOTE — ED Notes (Signed)
Pt reports carrying a bag of groceries when she tripped and bumped into her left index finger. Bruising with some swelling noted.

## 2018-08-11 ENCOUNTER — Emergency Department (HOSPITAL_COMMUNITY): Payer: Medicaid Other

## 2018-08-11 ENCOUNTER — Encounter: Payer: Self-pay | Admitting: Emergency Medicine

## 2018-08-11 ENCOUNTER — Emergency Department (HOSPITAL_COMMUNITY)
Admission: EM | Admit: 2018-08-11 | Discharge: 2018-08-11 | Disposition: A | Discharge status: Home / Self Care | Payer: Medicaid Other | Attending: Emergency Medicine | Admitting: Emergency Medicine

## 2018-08-11 DIAGNOSIS — O209 Hemorrhage in early pregnancy, unspecified: Secondary | ICD-10-CM | POA: Diagnosis present

## 2018-08-11 DIAGNOSIS — O9989 Other specified diseases and conditions complicating pregnancy, childbirth and the puerperium: Secondary | ICD-10-CM | POA: Insufficient documentation

## 2018-08-11 DIAGNOSIS — B373 Candidiasis of vulva and vagina: Secondary | ICD-10-CM | POA: Diagnosis not present

## 2018-08-11 DIAGNOSIS — B3731 Acute candidiasis of vulva and vagina: Secondary | ICD-10-CM

## 2018-08-11 DIAGNOSIS — O200 Threatened abortion: Secondary | ICD-10-CM

## 2018-08-11 DIAGNOSIS — Z3A01 Less than 8 weeks gestation of pregnancy: Secondary | ICD-10-CM

## 2018-08-11 LAB — URINALYSIS, ROUTINE W REFLEX MICROSCOPIC
BILIRUBIN URINE: NEGATIVE
GLUCOSE, UA: NEGATIVE mg/dL
Ketones, ur: 5 mg/dL — AB
NITRITE: NEGATIVE
PH: 6 (ref 5.0–8.0)
Protein, ur: NEGATIVE mg/dL
SPECIFIC GRAVITY, URINE: 1.025 (ref 1.005–1.030)

## 2018-08-11 LAB — COMPREHENSIVE METABOLIC PANEL
ALT: 21 U/L (ref 0–44)
ANION GAP: 10 (ref 5–15)
AST: 18 U/L (ref 15–41)
Albumin: 3.8 g/dL (ref 3.5–5.0)
Alkaline Phosphatase: 55 U/L (ref 38–126)
BILIRUBIN TOTAL: 0.9 mg/dL (ref 0.3–1.2)
BUN: 6 mg/dL (ref 6–20)
CHLORIDE: 105 mmol/L (ref 98–111)
CO2: 22 mmol/L (ref 22–32)
Calcium: 8.8 mg/dL — ABNORMAL LOW (ref 8.9–10.3)
Creatinine, Ser: 0.44 mg/dL (ref 0.44–1.00)
Glucose, Bld: 126 mg/dL — ABNORMAL HIGH (ref 70–99)
POTASSIUM: 3 mmol/L — AB (ref 3.5–5.1)
Sodium: 137 mmol/L (ref 135–145)
TOTAL PROTEIN: 6.8 g/dL (ref 6.5–8.1)

## 2018-08-11 LAB — CBC WITH DIFFERENTIAL/PLATELET
Abs Immature Granulocytes: 0.02 10*3/uL (ref 0.00–0.07)
BASOS PCT: 0 %
Basophils Absolute: 0 10*3/uL (ref 0.0–0.1)
EOS ABS: 0.1 10*3/uL (ref 0.0–0.5)
EOS PCT: 1 %
HEMATOCRIT: 35.3 % — AB (ref 36.0–46.0)
Hemoglobin: 11.6 g/dL — ABNORMAL LOW (ref 12.0–15.0)
Immature Granulocytes: 0 %
LYMPHS ABS: 2 10*3/uL (ref 0.7–4.0)
Lymphocytes Relative: 23 %
MCH: 29.7 pg (ref 26.0–34.0)
MCHC: 32.9 g/dL (ref 30.0–36.0)
MCV: 90.5 fL (ref 80.0–100.0)
MONOS PCT: 6 %
Monocytes Absolute: 0.5 10*3/uL (ref 0.1–1.0)
Neutro Abs: 5.9 10*3/uL (ref 1.7–7.7)
Neutrophils Relative %: 70 %
PLATELETS: 334 10*3/uL (ref 150–400)
RBC: 3.9 MIL/uL (ref 3.87–5.11)
RDW: 13.4 % (ref 11.5–15.5)
WBC: 8.6 10*3/uL (ref 4.0–10.5)
nRBC: 0 % (ref 0.0–0.2)

## 2018-08-11 LAB — WET PREP, GENITAL
CLUE CELLS WET PREP: NONE SEEN
SPERM: NONE SEEN
TRICH WET PREP: NONE SEEN

## 2018-08-11 LAB — HCG, QUANTITATIVE, PREGNANCY: HCG, BETA CHAIN, QUANT, S: 75508 m[IU]/mL — AB (ref <5)

## 2018-08-11 LAB — I-STAT BETA HCG BLOOD, ED (MC, WL, AP ONLY)

## 2018-08-11 MED ORDER — CLOTRIMAZOLE 1 % VA CREA: 1.0000 | TOPICAL_CREAM | Freq: Every day | VAGINAL | 0 refills | Status: AC

## 2018-08-11 NOTE — ED Triage Notes (Signed)
Pt states she had a positive home pregnancy test, pt states some cramping this morning and noticed blood on toilet tissue after she used the restroom.

## 2018-08-11 NOTE — ED Provider Notes (Signed)
[facility]-EMERGENCY DEPT Provider Note   CSN: [id number] Arrival date & time: 08/11/18  1148     History   Chief Complaint Chief Complaint  Patient presents with  . Possible Pregnancy  . Hematuria    HPI [name] is a 32 y.o. female.  HPI Patient resents with vaginal bleeding.  Positive home pregnancy test.  Last menses around a month and a half ago.  Slight crampy lower abdominal pain.  States she went to the bathroom and noticed some blood when she wiped.  He does not know her blood type.  No lightheadedness or dizziness.  No other bleeding.  She does have an OB.  This is her third pregnancy. History reviewed. No pertinent past medical history.  Patient Active Problem List   Diagnosis Date Noted  . OBESITY 06/04/2007    Past Surgical History:  Procedure Laterality Date  . CESAREAN SECTION       OB History   None      Home Medications    Prior to Admission medications   Medication Sig Start Date End Date Taking? Authorizing Provider  norgestimate-ethinyl estradiol (MILI) 0.25-35 MG-MCG tablet Take 1 tablet by mouth daily.   Yes [provider]  clotrimazole (GYNE-LOTRIMIN) 1 % vaginal cream Place 1 Applicatorful vaginally at bedtime for 7 days. 08/11/18 08/18/18  Benjiman Core, MD  naproxen (NAPROSYN) 500 MG tablet Take 1 tablet (500 mg total) by mouth 2 (two) times daily. Patient not taking: Reported on 08/11/2018 01/07/16   Felicie Morn, NP    Family History History reviewed. No pertinent family history.  Social History Social History   Tobacco Use  . Smoking status: Never Smoker  . Smokeless tobacco: Never Used  Substance Use Topics  . Alcohol use: No  . Drug use: No     Allergies   Patient has no known allergies.   Review of Systems Review of Systems  Constitutional: Negative for appetite change, chills and fever.  HENT: Negative for congestion, ear pain and sore throat.   Eyes: Negative for pain and  visual disturbance.  Respiratory: Negative for cough and shortness of breath.   Cardiovascular: Negative for chest pain and palpitations.  Gastrointestinal: Positive for abdominal pain. Negative for vomiting.  Genitourinary: Positive for vaginal bleeding. Negative for dysuria and hematuria.  Musculoskeletal: Negative for arthralgias and back pain.  Skin: Negative for color change and rash.  Neurological: Negative for seizures, syncope and weakness.  Psychiatric/Behavioral: Negative for confusion.  All other systems reviewed and are negative.    Physical Exam Updated Vital Signs BP (!) 157/85 (BP Location: Left Arm)   Pulse 85   Temp 98.6 F (37 C) (Oral)   Resp 14   LMP 06/26/2018 (Exact Date)   SpO2 100%   Physical Exam  Constitutional: She appears well-developed.  HENT:  Head: Normocephalic.  Eyes: Pupils are equal, round, and reactive to light.  Neck: Neck supple.  Cardiovascular: Normal rate.  Pulmonary/Chest: Effort normal.  Abdominal: There is no tenderness.  Genitourinary:  Genitourinary Comments: Vaginal bleeding.  Os is closed.  Mild white vaginal discharge.  Musculoskeletal: She exhibits no edema.  Neurological: She is alert.  Skin: Skin is warm. Capillary refill takes less than 2 seconds.     ED Treatments / Results  Labs (all labs ordered are listed, but only abnormal results are displayed) Labs Reviewed  WET PREP, GENITAL - Abnormal; Notable for the following components:      Result Value  Yeast Wet Prep HPF POC PRESENT (*)    WBC, Wet Prep HPF POC MODERATE (*)    All other components within normal limits  HCG, QUANTITATIVE, PREGNANCY - Abnormal; Notable for the following components:   hCG, Beta Chain, Quant, S 75,508 (*)    All other components within normal limits  CBC WITH DIFFERENTIAL/PLATELET - Abnormal; Notable for the following components:   Hemoglobin 11.6 (*)    HCT 35.3 (*)    All other components within normal limits  COMPREHENSIVE  METABOLIC PANEL - Abnormal; Notable for the following components:   Potassium 3.0 (*)    Glucose, Bld 126 (*)    Calcium 8.8 (*)    All other components within normal limits  URINALYSIS, ROUTINE W REFLEX MICROSCOPIC - Abnormal; Notable for the following components:   APPearance CLOUDY (*)    Hgb urine dipstick LARGE (*)    Ketones, ur 5 (*)    Leukocytes, UA TRACE (*)    Bacteria, UA FEW (*)    All other components within normal limits  I-STAT BETA HCG BLOOD, ED ([facility], [facility], [facility] ONLY) - Abnormal; Notable for the following components:   I-stat hCG, quantitative >2,000.0 (*)    All other components within normal limits  RPR  HIV ANTIBODY (ROUTINE TESTING W REFLEX)  GC/CHLAMYDIA PROBE AMP ([place]) NOT AT [facility]    EKG None  Radiology US Ob Comp < 14 Wks  Result Date: 08/11/2018 CLINICAL DATA:  Vaginal bleeding during pregnancy. Estimated gestational age of [redacted] weeks, 4 days by LMP. EXAM: OBSTETRIC <14 WK [place] AND TRANSVAGINAL OB US TECHNIQUE: Both transabdominal and transvaginal ultrasound examinations were performed for complete evaluation of the gestation as well as the maternal uterus, adnexal regions, and pelvic cul-de-sac. Transvaginal technique was performed to assess early pregnancy. COMPARISON:  CT abdomen pelvis dated May 24, 2011. Pelvic ultrasound dated December 03, 2007. FINDINGS: Intrauterine gestational sac: Single Yolk sac:  Visualized. Embryo:  Visualized. Cardiac Activity: Visualized. Heart Rate: 110 bpm CRL:  8 mm   6 w   5 d                  [place] EDC: 04/01/2019 Subchorionic hemorrhage:  Small subchorionic hemorrhage. Maternal uterus/adnexae: There is a large 6.7 x 5.8 x 5.7 cm simple appearing cyst in the right ovary. Normal left ovary. Probable posterior uterine fibroid. IMPRESSION: 1. Single, live intrauterine pregnancy with estimated gestational age of [redacted] weeks, 5 days. 2. Small subchorionic hemorrhage. 3. Large 6.7 cm simple cyst in the right ovary. This has benign  characteristics. No imaging follow up is required for premenopausal females. This follows consensus guidelines: Simple Adnexal Cysts: SRU Consensus Conference Update on Follow-up and Reporting. Radiology 2019; [id number]. Electronically Signed   By: Obie Dredge M.D.   On: 08/11/2018 14:47   US Ob Transvaginal  Result Date: 08/11/2018 CLINICAL DATA:  Vaginal bleeding during pregnancy. Estimated gestational age of [redacted] weeks, 4 days by LMP. EXAM: OBSTETRIC <14 WK [place] AND TRANSVAGINAL OB US TECHNIQUE: Both transabdominal and transvaginal ultrasound examinations were performed for complete evaluation of the gestation as well as the maternal uterus, adnexal regions, and pelvic cul-de-sac. Transvaginal technique was performed to assess early pregnancy. COMPARISON:  CT abdomen pelvis dated May 24, 2011. Pelvic ultrasound dated December 03, 2007. FINDINGS: Intrauterine gestational sac: Single Yolk sac:  Visualized. Embryo:  Visualized. Cardiac Activity: Visualized. Heart Rate: 110 bpm CRL:  8 mm   6 w   5 d  [place] EDC: 04/01/2019 Subchorionic hemorrhage:  Small subchorionic hemorrhage. Maternal uterus/adnexae: There is a large 6.7 x 5.8 x 5.7 cm simple appearing cyst in the right ovary. Normal left ovary. Probable posterior uterine fibroid. IMPRESSION: 1. Single, live intrauterine pregnancy with estimated gestational age of [redacted] weeks, 5 days. 2. Small subchorionic hemorrhage. 3. Large 6.7 cm simple cyst in the right ovary. This has benign characteristics. No imaging follow up is required for premenopausal females. This follows consensus guidelines: Simple Adnexal Cysts: SRU Consensus Conference Update on Follow-up and Reporting. Radiology 2019; [id number]. Electronically Signed   By: Obie Dredge M.D.   On: 08/11/2018 14:47    Procedures Procedures (including critical care time)  Medications Ordered in ED Medications - No data to display   Initial Impression / Assessment and Plan / ED Course   I have reviewed the triage vital signs and the nursing notes.  Pertinent labs & imaging results that were available during my care of the patient were reviewed by me and considered in my medical decision making (see chart for details).     Patient pregnant with threatened miscarriage.  Labs reassuring but does have bacterial vaginosis.  Ultrasound shows intrauterine pregnancy.  She had any has OB follow-up.  Discharge home.  Will treat for yeast infection  Final Clinical Impressions(s) / ED Diagnoses   Final diagnoses:  Less than [redacted] weeks gestation of pregnancy  Threatened miscarriage in early pregnancy  Yeast vaginitis    ED Discharge Orders         Ordered    clotrimazole (GYNE-LOTRIMIN) 1 % vaginal cream  Daily at bedtime     08/11/18 1531           Benjiman Core, MD 08/11/18 1621

## 2018-08-11 NOTE — Discharge Instructions (Signed)
Follow-up with your obstetrician for the pregnancy.

## 2018-08-12 LAB — HIV ANTIBODY (ROUTINE TESTING W REFLEX): HIV SCREEN 4TH GENERATION: NONREACTIVE

## 2018-08-12 LAB — RPR: RPR Ser Ql: NONREACTIVE

## 2018-08-13 LAB — GC/CHLAMYDIA PROBE AMP (~~LOC~~) NOT AT ARMC
Chlamydia: POSITIVE — AB
NEISSERIA GONORRHEA: NEGATIVE

## 2018-08-16 ENCOUNTER — Telehealth: Payer: Self-pay | Admitting: Student

## 2018-08-16 DIAGNOSIS — A749 Chlamydial infection, unspecified: Secondary | ICD-10-CM

## 2018-08-16 MED ORDER — AZITHROMYCIN 500 MG PO TABS: 1000.0000 mg | ORAL_TABLET | Freq: Once | ORAL | 0 refills | Status: AC

## 2018-08-16 NOTE — Telephone Encounter (Addendum)
[name] tested positive for  Chlamydia. Patient was called by RN and allergies and pharmacy confirmed. Rx sent to pharmacy of choice.   Judeth Horn, NP 08/16/2018 8:37 PM         ----- Message from Kathe Becton, RN sent at 08/16/2018 11:28 AM EST ----- This patient tested positive for :  Chlamydia  She :"has NKDA", I have informed the patient of her results and confirmed her pharmacy is correct in her chart. Please send Rx.   Thank you,   Kathe Becton, RN   Results faxed to [facility] Department.

## 2018-08-21 ENCOUNTER — Encounter (HOSPITAL_COMMUNITY): Payer: Self-pay

## 2018-08-28 DIAGNOSIS — O3429 Maternal care due to uterine scar from other previous surgery: Secondary | ICD-10-CM | POA: Insufficient documentation

## 2018-08-29 LAB — OB RESULTS CONSOLE HIV ANTIBODY (ROUTINE TESTING): HIV: NONREACTIVE

## 2018-08-29 LAB — OB RESULTS CONSOLE RUBELLA ANTIBODY, IGM: Rubella: IMMUNE

## 2018-08-29 LAB — OB RESULTS CONSOLE GC/CHLAMYDIA
Chlamydia: NEGATIVE
Gonorrhea: NEGATIVE

## 2018-08-29 LAB — OB RESULTS CONSOLE HEPATITIS B SURFACE ANTIGEN: Hepatitis B Surface Ag: NEGATIVE

## 2018-08-29 LAB — OB RESULTS CONSOLE RPR: RPR: NONREACTIVE

## 2018-08-29 LAB — OB RESULTS CONSOLE ANTIBODY SCREEN: Antibody Screen: NEGATIVE

## 2018-08-29 LAB — OB RESULTS CONSOLE ABO/RH: RH Type: POSITIVE

## 2018-10-10 NOTE — L&D Delivery Note (Signed)
Operative Delivery Note At 10:24 PM a viable female was delivered via Vaginal, Vacuum Neurosurgeon).  Presentation: vertex; Position: Left,, Occiput,, Anterior; Station: +2.  Verbal consent: obtained from patient.  Risks and benefits discussed in detail.  Risks include, but are not limited to the risks of anesthesia, bleeding, infection, damage to maternal tissues, fetal cephalhematoma.  There is also the risk of inability to effect vaginal delivery of the head, or shoulder dystocia that cannot be resolved by established maneuvers, leading to the need for emergency cesarean section.  Vacuum assistance offered due to maternal pain with pushing and difficulty distinguishing between fetal and maternal heart rate.  Vacuum in green zone with 2 ctx, one pop-off.  APGAR: 7, 9; weight pending .   Placenta status: spontaneous, intact.   Cord:  with the following complications: nuchal x 1 reduced.  Uterine scar palpated intact.  Anesthesia:  Epidural, local Instruments: [place] Episiotomy:  None Lacerations:  1st degree perineal Suture Repair: 3.0 vicryl rapide Est. Blood Loss (mL):  237  Mom to postpartum.  Baby to Couplet care / Skin to Skin.  Blane Ohara Xoe Hoe 03/27/2019, 10:47 PM

## 2018-10-23 ENCOUNTER — Encounter (HOSPITAL_COMMUNITY): Payer: Self-pay | Admitting: *Deleted

## 2018-10-23 ENCOUNTER — Inpatient Hospital Stay (HOSPITAL_COMMUNITY)
Admission: AD | Admit: 2018-10-23 | Discharge: 2018-10-23 | Disposition: A | Discharge status: Home / Self Care | Payer: Medicaid Other | Attending: Obstetrics and Gynecology | Admitting: Obstetrics and Gynecology

## 2018-10-23 DIAGNOSIS — Z3A17 17 weeks gestation of pregnancy: Secondary | ICD-10-CM | POA: Diagnosis not present

## 2018-10-23 DIAGNOSIS — O26892 Other specified pregnancy related conditions, second trimester: Secondary | ICD-10-CM | POA: Insufficient documentation

## 2018-10-23 DIAGNOSIS — O26899 Other specified pregnancy related conditions, unspecified trimester: Secondary | ICD-10-CM | POA: Diagnosis not present

## 2018-10-23 DIAGNOSIS — R109 Unspecified abdominal pain: Secondary | ICD-10-CM

## 2018-10-23 DIAGNOSIS — R1030 Lower abdominal pain, unspecified: Secondary | ICD-10-CM | POA: Insufficient documentation

## 2018-10-23 LAB — URINALYSIS, ROUTINE W REFLEX MICROSCOPIC
BILIRUBIN URINE: NEGATIVE
Glucose, UA: NEGATIVE mg/dL
HGB URINE DIPSTICK: NEGATIVE
KETONES UR: NEGATIVE mg/dL
Leukocytes, UA: NEGATIVE
Nitrite: NEGATIVE
Protein, ur: NEGATIVE mg/dL
SPECIFIC GRAVITY, URINE: 1.027 (ref 1.005–1.030)
pH: 5 (ref 5.0–8.0)

## 2018-10-23 NOTE — MAU Note (Signed)
PT  GETS PNC  WITH DR MEISINGER.   SAYS AT WORK AT 4 PM-  STARTED FEELING CRAMPING  LOWER ABD - WHEN STOOD - HURT WORSE.   THEN AT 6  STOPPED-  THEN RESTARTED .   NOW LESS THAN  BEFORE.   LAST SEX- 2 WEEKS AGO.   Marland Kitchen

## 2018-10-23 NOTE — MAU Provider Note (Signed)
Chief Complaint:  Abdominal Pain   First Provider Initiated Contact with Patient 10/23/18 2018     HPI: [name] is a 33 y.o. G3P0 at 5817w3dwho presents to maternity admissions reporting lower abdominal pain since this afternoon.  States it came and went away.  Then it came back but has now subsided again.  Did not take anything for it or call the office about it.  No history or preterm labor or any problems with this or prior pregnancies. . She denies LOF, vaginal bleeding, vaginal itching/burning, urinary symptoms, h/a, dizziness, n/v, diarrhea, constipation or fever/chills.  .  Abdominal Pain  This is a new problem. The current episode started today. The onset quality is gradual. The problem occurs intermittently. The problem has been waxing and waning. The pain is located in the LLQ, RLQ and suprapubic region. The pain is mild. The quality of the pain is cramping. The abdominal pain does not radiate. Pertinent negatives include no constipation, diarrhea, dysuria, fever, frequency, headaches, myalgias, nausea or vomiting. Nothing aggravates the pain. The pain is relieved by nothing. She has tried nothing for the symptoms.    RN Note: PT  GETS PNC  WITH DR MEISINGER.   SAYS AT WORK AT 4 PM-  STARTED FEELING CRAMPING  LOWER ABD - WHEN STOOD - HURT WORSE.   THEN AT 6  STOPPED-  THEN RESTARTED .   NOW LESS THAN  BEFORE.   LAST SEX- 2 WEEKS AGO.   Marland Kitchen.  Past Medical History: History reviewed. No pertinent past medical history.  Past obstetric history: OB History  Gravida Para Term Preterm AB Living  3         2  SAB TAB Ectopic Multiple Live Births  0 0 0   2    # Outcome Date GA Lbr Len/2nd Weight Sex Delivery Anes PTL Lv  3 Current           2 Gravida      Vag-Spont     1 Gravida      CS-LTranv       Past Surgical History: Past Surgical History:  Procedure Laterality Date  . CESAREAN SECTION      Family History: History reviewed. No pertinent family history.  Social  History: Social History   Tobacco Use  . Smoking status: Never Smoker  . Smokeless tobacco: Never Used  Substance Use Topics  . Alcohol use: No  . Drug use: No    Allergies: No Known Allergies  Meds:  Medications Prior to Admission  Medication Sig Dispense Refill Last Dose  . Prenatal Vit-Fe Fumarate-FA (PRENATAL MULTIVITAMIN) TABS tablet Take 1 tablet by mouth daily at 12 noon.   10/23/2018 at Unknown time  . naproxen (NAPROSYN) 500 MG tablet Take 1 tablet (500 mg total) by mouth 2 (two) times daily. (Patient not taking: Reported on 08/11/2018) 20 tablet 0 More than a month at Unknown time  . norgestimate-ethinyl estradiol (MILI) 0.25-35 MG-MCG tablet Take 1 tablet by mouth daily.   More than a month at Unknown time    I have reviewed patient's Past Medical Hx, Surgical Hx, Family Hx, Social Hx, medications and allergies.   ROS:  Review of Systems  Constitutional: Negative for fever.  Gastrointestinal: Positive for abdominal pain. Negative for constipation, diarrhea, nausea and vomiting.  Genitourinary: Negative for dysuria and frequency.  Musculoskeletal: Negative for myalgias.  Neurological: Negative for headaches.   Other systems negative  Physical Exam   Patient Vitals for the past  24 hrs:  BP Temp Temp src Pulse Resp Height Weight  10/23/18 1922 128/77 98.1 F (36.7 C) Oral 92 20 5\' 2"  (1.575 m) 111.7 kg   Constitutional: Well-developed, well-nourished female in no acute distress.  Cardiovascular: normal rate and rhythm Respiratory: normal effort, clear to auscultation bilaterally GI: Abd soft, non-tender, gravid appropriate for gestational age.   No rebound or guarding. MS: Extremities nontender, no edema, normal ROM Neurologic: Alert and oriented x 4.  GU: Neg CVAT.  PELVIC EXAM: Bimanual exam: Cervix firm, posterior, neg CMT, uterus nontender, Fundal Height consistent with dates, adnexa without tenderness, enlargement, or mass Cervix Firm, long and closed,  posterior   FHT: 152   Labs: Results for orders placed or performed during the hospital encounter of 10/23/18 (from the past 24 hour(s))  Urinalysis, Routine w reflex microscopic     Status: Abnormal   Collection Time: 10/23/18  7:45 PM  Result Value Ref Range   Color, Urine YELLOW YELLOW   APPearance HAZY (A) CLEAR   Specific Gravity, Urine 1.027 1.005 - 1.030   pH 5.0 5.0 - 8.0   Glucose, UA NEGATIVE NEGATIVE mg/dL   Hgb urine dipstick NEGATIVE NEGATIVE   Bilirubin Urine NEGATIVE NEGATIVE   Ketones, ur NEGATIVE NEGATIVE mg/dL   Protein, ur NEGATIVE NEGATIVE mg/dL   Nitrite NEGATIVE NEGATIVE   Leukocytes, UA NEGATIVE NEGATIVE      Imaging:  No results found.  MAU Course/MDM: I have ordered labs and reviewed results. Urine is clear, no evidence of UTI Discussed possible etiologies.  Possibly round ligament, but doubt.  Probably uterine cramping due to rapid growth of uterus. Urine is clear.  Discussed signs of preterm labor and what to report.  If she continues to have frequent cramping or discharge or bleeding needs close followup .  Recommend checking in with provider. .  States not having pain now Treatments in MAU included none.    Assessment: Single intrauterine pregnancy at 4142w3d Uterine cramping  No change in cervix Doubt preterm labor  Plan: Discharge home Preterm Labor precautions and fetal kick counts Follow up in Office for prenatal visits and recheck cervix  Encouraged to return here or to other Urgent Care/ED if she develops worsening of symptoms, increase in pain, fever, or other concerning symptoms.   Pt stable at time of discharge.  Wynelle BourgeoisMarie Lanard Arguijo CNM, MSN Certified Nurse-Midwife 10/23/2018 8:19 PM

## 2018-10-23 NOTE — Discharge Instructions (Signed)
Abdominal Pain During Pregnancy ° °Abdominal pain is common during pregnancy, and has many possible causes. Some causes are more serious than others, and sometimes the cause is not known. Abdominal pain can be a sign that labor is starting. It can also be caused by normal growth and stretching of muscles and ligaments during pregnancy. Always tell your health care provider if you have any abdominal pain. °Follow these instructions at home: °· Do not have sex or put anything in your vagina until your pain goes away completely. °· Get plenty of rest until your pain improves. °· Drink enough fluid to keep your urine pale yellow. °· Take over-the-counter and prescription medicines only as told by your health care provider. °· Keep all follow-up visits as told by your health care provider. This is important. °Contact a health care provider if: °· Your pain continues or gets worse after resting. °· You have lower abdominal pain that: °? Comes and goes at regular intervals. °? Spreads to your back. °? Is similar to menstrual cramps. °· You have pain or burning when you urinate. °Get help right away if: °· You have a fever or chills. °· You have vaginal bleeding. °· You are leaking fluid from your vagina. °· You are passing tissue from your vagina. °· You have vomiting or diarrhea that lasts for more than 24 hours. °· Your baby is moving less than usual. °· You feel very weak or faint. °· You have shortness of breath. °· You develop severe pain in your upper abdomen. °Summary °· Abdominal pain is common during pregnancy, and has many possible causes. °· If you experience abdominal pain during pregnancy, tell your health care provider right away. °· Follow your health care provider's home care instructions and keep all follow-up visits as directed. °This information is not intended to replace advice given to you by your health care provider. Make sure you discuss any questions you have with your health care  provider. °Document Released: 09/26/2005 Document Revised: 12/29/2016 Document Reviewed: 12/29/2016 °Elsevier Interactive Patient Education © 2019 Elsevier Inc. ° °

## 2018-11-05 ENCOUNTER — Encounter (HOSPITAL_COMMUNITY): Payer: Self-pay

## 2018-11-07 ENCOUNTER — Other Ambulatory Visit (HOSPITAL_COMMUNITY): Payer: Self-pay | Admitting: Obstetrics and Gynecology

## 2018-11-07 ENCOUNTER — Encounter (HOSPITAL_COMMUNITY): Payer: Self-pay

## 2018-11-07 ENCOUNTER — Other Ambulatory Visit (HOSPITAL_COMMUNITY): Payer: Self-pay | Admitting: *Deleted

## 2018-11-07 ENCOUNTER — Ambulatory Visit (HOSPITAL_COMMUNITY)
Admission: RE | Admit: 2018-11-07 | Discharge: 2018-11-07 | Disposition: A | Discharge status: Home / Self Care | Payer: Medicaid Other | Source: Ambulatory Visit | Attending: Obstetrics and Gynecology | Admitting: Obstetrics and Gynecology

## 2018-11-07 DIAGNOSIS — O99212 Obesity complicating pregnancy, second trimester: Secondary | ICD-10-CM

## 2018-11-07 DIAGNOSIS — Z363 Encounter for antenatal screening for malformations: Secondary | ICD-10-CM | POA: Diagnosis not present

## 2018-11-07 DIAGNOSIS — Z3A19 19 weeks gestation of pregnancy: Secondary | ICD-10-CM | POA: Diagnosis not present

## 2018-11-07 DIAGNOSIS — Z3689 Encounter for other specified antenatal screening: Secondary | ICD-10-CM | POA: Diagnosis not present

## 2018-11-07 DIAGNOSIS — O34219 Maternal care for unspecified type scar from previous cesarean delivery: Secondary | ICD-10-CM | POA: Diagnosis not present

## 2018-11-07 DIAGNOSIS — Z362 Encounter for other antenatal screening follow-up: Secondary | ICD-10-CM

## 2018-11-07 HISTORY — DX: Unspecified abnormal cytological findings in specimens from vagina: R87.629

## 2018-12-05 ENCOUNTER — Ambulatory Visit (HOSPITAL_COMMUNITY)
Admission: RE | Admit: 2018-12-05 | Discharge: 2018-12-05 | Disposition: A | Discharge status: Home / Self Care | Payer: Medicaid Other | Source: Ambulatory Visit | Attending: Obstetrics and Gynecology | Admitting: Obstetrics and Gynecology

## 2018-12-05 ENCOUNTER — Other Ambulatory Visit (HOSPITAL_COMMUNITY): Payer: Self-pay | Admitting: *Deleted

## 2018-12-05 DIAGNOSIS — Z362 Encounter for other antenatal screening follow-up: Secondary | ICD-10-CM

## 2018-12-05 DIAGNOSIS — Z3A23 23 weeks gestation of pregnancy: Secondary | ICD-10-CM

## 2018-12-05 DIAGNOSIS — O34219 Maternal care for unspecified type scar from previous cesarean delivery: Secondary | ICD-10-CM | POA: Diagnosis not present

## 2018-12-05 DIAGNOSIS — O99212 Obesity complicating pregnancy, second trimester: Secondary | ICD-10-CM

## 2018-12-06 ENCOUNTER — Encounter (HOSPITAL_COMMUNITY): Payer: Self-pay | Admitting: Emergency Medicine

## 2018-12-06 ENCOUNTER — Ambulatory Visit (HOSPITAL_COMMUNITY)
Admission: EM | Admit: 2018-12-06 | Discharge: 2018-12-06 | Disposition: A | Discharge status: Home / Self Care | Payer: Medicaid Other | Attending: Emergency Medicine | Admitting: Emergency Medicine

## 2018-12-06 DIAGNOSIS — J029 Acute pharyngitis, unspecified: Secondary | ICD-10-CM | POA: Diagnosis not present

## 2018-12-06 LAB — POCT RAPID STREP A: STREPTOCOCCUS, GROUP A SCREEN (DIRECT): NEGATIVE

## 2018-12-06 MED ORDER — FLUTICASONE PROPIONATE 50 MCG/ACT NA SUSP: 2.0000 | Freq: Every day | NASAL | 0 refills | Status: AC

## 2018-12-06 NOTE — ED Triage Notes (Signed)
PT C/O: cold sx onset 4 days associated w/hoarseness, nasal congestion, cough, sore throat, diarrhea  Pt is [redacted] weeks pregnant.   DENIES: f/v/n  TAKING MEDS: none  A&O x4... NAD... Ambulatory

## 2018-12-06 NOTE — ED Provider Notes (Signed)
HPI  SUBJECTIVE:  [name] is a G3, P2 33 y.o. female who presents with 4 days of sore throat, nonproductive cough, nasal congestion, rhinorrhea, occasional headaches.  She reports one episode of diarrhea per day for the past several days.  No fevers, body aches, abdominal pain, rash, neck stiffness.  She reports laryngitis, but this has resolved.  Denies muffled voice.  No sensation of her throat swelling shut, difficulty breathing, trismus, drooling.  No allergy or GERD symptoms.  No antibiotics in the past month.  No antipyretic in the past 4 to 6 hours.  No contacts with strep or mono.  She tried cough drops with improvement in her symptoms.  Symptoms are worse with swallowing and drinking cold liquids.  She is [redacted] weeks pregnant.  She states that she is feeling baby move.  No contractions, leakage of fluid, vaginal bleeding.  She has had prenatal care.  No history of frequent strep, mono, GERD, allergies.  PMD: Dr. Dewain Penning  Past Medical History:  Diagnosis Date  . Vaginal Pap smear, abnormal     Past Surgical History:  Procedure Laterality Date  . CESAREAN SECTION    . WISDOM TOOTH EXTRACTION      Family History  Problem Relation Age of Onset  . Diabetes Maternal Grandmother     Social History   Tobacco Use  . Smoking status: Never Smoker  . Smokeless tobacco: Never Used  Substance Use Topics  . Alcohol use: No  . Drug use: No    No current facility-administered medications for this encounter.   Current Outpatient Medications:  .  fluticasone (FLONASE) 50 MCG/ACT nasal spray, Place 2 sprays into both nostrils daily., Disp: 16 g, Rfl: 0 .  Prenatal Vit-Fe Fumarate-FA (PRENATAL MULTIVITAMIN) TABS tablet, Take 1 tablet by mouth daily at 12 noon., Disp: , Rfl:   No Known Allergies   ROS  As noted in HPI.   Physical Exam  BP (!) 146/77 (BP Location: Left Arm)   Pulse 97   Temp 98.4 F (36.9 C) (Oral)   Resp 18   LMP 06/23/2018   SpO2 100%    Constitutional: Well developed, well nourished, no acute distress Eyes:  EOMI, conjunctiva normal bilaterally HENT: Normocephalic, atraumatic,mucus membranes moist.  Positive nasal congestion with erythematous, swollen turbinates.  Normal tonsils without exudates.  Uvula midline.  No petechiae on palate.  No postnasal drip.  Positive cobblestoning. Neck: No cervical lymphadenopathy Respiratory: Normal inspiratory effort Cardiovascular: Normal rate, regular rhythm, no murmurs rubs or gallops  GI: Gravid abdomen.  Fundus size appropriate for dates.  No appreciable splenomegaly. skin: No rash, skin intact Musculoskeletal: no deformities Neurologic: Alert & oriented x 3, no focal neuro deficits Psychiatric: Speech and behavior appropriate   ED Course   Medications - No data to display  Orders Placed This Encounter  Procedures  . POCT rapid strep A [facility])    Standing Status:   Standing    Number of Occurrences:   1    ED Clinical Impression  Acute pharyngitis, unspecified etiology   ED Assessment/Plan  Rapid strep negative.  Presentation consistent with a viral pharyngitis.  Obtaining throat culture to guide antibiotic treatment.  Discussed this with patient.  Home with supportive treatment including Tylenol, Flonase, saline nasal irrigation,  Benadryl/Maalox mixture.   Discussed labs,  MDM, treatment plan, and plan for follow-up with patient. Discussed sn/sx that should prompt return to the ED. patient agrees with plan.   Meds ordered this encounter  Medications  . fluticasone (FLONASE) 50 MCG/ACT nasal spray    Sig: Place 2 sprays into both nostrils daily.    Dispense:  16 g    Refill:  0    *This clinic note was created using Scientist, clinical (histocompatibility and immunogenetics). Therefore, there may be occasional mistakes despite careful proofreading.   ?   Domenick Gong, MD 12/06/18 [id number]

## 2018-12-06 NOTE — Discharge Instructions (Signed)
your rapid strep was negative today, so we have sent off a throat culture.  We will contact you and call in the appropriate antibiotics if your culture comes back positive for an infection requiring antibiotic treatment.  Give [place] a working phone number.   1 gram of Tylenol 3-4 times a day as needed for pain.  Make sure you drink plenty of extra fluids.  Some people find salt water gargles and  Traditional Medicinal's "Throat Coat" tea helpful. Take 5 mL of liquid Benadryl and 5 mL of Maalox. Mix it together, and then hold it in your mouth for as long as you can and then swallow. You may do this 4 times a day.    The Flonase and some saline nasal irrigation with a Lloyd Huger med sinus rinse and distilled water as often as you want for the nasal congestion. this will help with the cough as well.  Go to www.goodrx.com to look up your medications. This will give you a list of where you can find your prescriptions at the most affordable prices. Or ask the pharmacist what the cash price is, or if they have any other discount programs available to help make your medication more affordable. This can be less expensive than what you would pay with insurance.

## 2019-01-02 ENCOUNTER — Ambulatory Visit (HOSPITAL_COMMUNITY): Payer: Medicaid Other

## 2019-02-12 ENCOUNTER — Other Ambulatory Visit (HOSPITAL_COMMUNITY): Payer: Self-pay | Admitting: *Deleted

## 2019-02-12 ENCOUNTER — Other Ambulatory Visit: Payer: Self-pay

## 2019-02-12 ENCOUNTER — Ambulatory Visit (HOSPITAL_COMMUNITY)
Admission: RE | Admit: 2019-02-12 | Discharge: 2019-02-12 | Disposition: A | Discharge status: Home / Self Care | Payer: Medicaid Other | Source: Ambulatory Visit | Attending: Maternal & Fetal Medicine | Admitting: Maternal & Fetal Medicine

## 2019-02-12 DIAGNOSIS — O99213 Obesity complicating pregnancy, third trimester: Secondary | ICD-10-CM

## 2019-02-12 DIAGNOSIS — O34219 Maternal care for unspecified type scar from previous cesarean delivery: Secondary | ICD-10-CM

## 2019-02-12 DIAGNOSIS — Z362 Encounter for other antenatal screening follow-up: Secondary | ICD-10-CM | POA: Insufficient documentation

## 2019-02-12 DIAGNOSIS — Z3A33 33 weeks gestation of pregnancy: Secondary | ICD-10-CM

## 2019-03-08 LAB — OB RESULTS CONSOLE GBS: GBS: NEGATIVE

## 2019-03-12 ENCOUNTER — Ambulatory Visit (HOSPITAL_COMMUNITY)
Admission: RE | Admit: 2019-03-12 | Discharge: 2019-03-12 | Disposition: A | Discharge status: Home / Self Care | Payer: Medicaid Other | Source: Ambulatory Visit | Attending: Obstetrics and Gynecology | Admitting: Obstetrics and Gynecology

## 2019-03-12 ENCOUNTER — Other Ambulatory Visit: Payer: Self-pay

## 2019-03-12 DIAGNOSIS — O99213 Obesity complicating pregnancy, third trimester: Secondary | ICD-10-CM | POA: Insufficient documentation

## 2019-03-12 DIAGNOSIS — O34219 Maternal care for unspecified type scar from previous cesarean delivery: Secondary | ICD-10-CM

## 2019-03-12 DIAGNOSIS — Z362 Encounter for other antenatal screening follow-up: Secondary | ICD-10-CM | POA: Diagnosis not present

## 2019-03-12 DIAGNOSIS — Z3A37 37 weeks gestation of pregnancy: Secondary | ICD-10-CM

## 2019-03-18 ENCOUNTER — Telehealth (HOSPITAL_COMMUNITY): Payer: Self-pay | Admitting: *Deleted

## 2019-03-18 ENCOUNTER — Encounter (HOSPITAL_COMMUNITY): Payer: Self-pay | Admitting: *Deleted

## 2019-03-18 NOTE — Telephone Encounter (Signed)
Preadmission screen  

## 2019-03-25 ENCOUNTER — Other Ambulatory Visit: Payer: Self-pay

## 2019-03-25 ENCOUNTER — Other Ambulatory Visit (HOSPITAL_COMMUNITY)
Admission: RE | Admit: 2019-03-25 | Discharge: 2019-03-25 | Disposition: A | Discharge status: Home / Self Care | Payer: Medicaid Other | Source: Ambulatory Visit | Attending: Obstetrics and Gynecology | Admitting: Obstetrics and Gynecology

## 2019-03-25 DIAGNOSIS — Z1159 Encounter for screening for other viral diseases: Secondary | ICD-10-CM | POA: Insufficient documentation

## 2019-03-25 NOTE — MAU Note (Signed)
Asymptomatic, swab collected without problem. 

## 2019-03-26 ENCOUNTER — Other Ambulatory Visit: Payer: Self-pay | Admitting: Obstetrics and Gynecology

## 2019-03-26 ENCOUNTER — Other Ambulatory Visit (HOSPITAL_COMMUNITY): Payer: Self-pay | Admitting: *Deleted

## 2019-03-26 LAB — NOVEL CORONAVIRUS, NAA (HOSP ORDER, SEND-OUT TO REF LAB; TAT 18-24 HRS): SARS-CoV-2, NAA: NOT DETECTED

## 2019-03-27 ENCOUNTER — Inpatient Hospital Stay (HOSPITAL_COMMUNITY): Payer: Medicaid Other | Admitting: Anesthesiology

## 2019-03-27 ENCOUNTER — Encounter (HOSPITAL_COMMUNITY): Payer: Self-pay

## 2019-03-27 ENCOUNTER — Inpatient Hospital Stay (HOSPITAL_COMMUNITY)
Admission: AD | Admit: 2019-03-27 | Discharge: 2019-03-29 | DRG: 807 | Disposition: A | Discharge status: Home / Self Care | Payer: Medicaid Other | Attending: Obstetrics and Gynecology | Admitting: Obstetrics and Gynecology

## 2019-03-27 ENCOUNTER — Inpatient Hospital Stay (HOSPITAL_COMMUNITY): Payer: Medicaid Other

## 2019-03-27 ENCOUNTER — Other Ambulatory Visit: Payer: Self-pay

## 2019-03-27 DIAGNOSIS — Z3A39 39 weeks gestation of pregnancy: Secondary | ICD-10-CM

## 2019-03-27 DIAGNOSIS — O34219 Maternal care for unspecified type scar from previous cesarean delivery: Secondary | ICD-10-CM | POA: Diagnosis not present

## 2019-03-27 DIAGNOSIS — O99214 Obesity complicating childbirth: Secondary | ICD-10-CM | POA: Diagnosis present

## 2019-03-27 DIAGNOSIS — O34211 Maternal care for low transverse scar from previous cesarean delivery: Secondary | ICD-10-CM | POA: Diagnosis present

## 2019-03-27 DIAGNOSIS — O26893 Other specified pregnancy related conditions, third trimester: Secondary | ICD-10-CM | POA: Diagnosis present

## 2019-03-27 LAB — CBC
HCT: 36.6 % (ref 36.0–46.0)
Hemoglobin: 12.6 g/dL (ref 12.0–15.0)
MCH: 30.9 pg (ref 26.0–34.0)
MCHC: 34.4 g/dL (ref 30.0–36.0)
MCV: 89.7 fL (ref 80.0–100.0)
Platelets: 244 10*3/uL (ref 150–400)
RBC: 4.08 MIL/uL (ref 3.87–5.11)
RDW: 14.4 % (ref 11.5–15.5)
WBC: 12.1 10*3/uL — ABNORMAL HIGH (ref 4.0–10.5)
nRBC: 0 % (ref 0.0–0.2)

## 2019-03-27 LAB — ABO/RH: ABO/RH(D): O POS

## 2019-03-27 LAB — TYPE AND SCREEN
ABO/RH(D): O POS
Antibody Screen: NEGATIVE

## 2019-03-27 LAB — RPR: RPR Ser Ql: NONREACTIVE

## 2019-03-27 MED ORDER — OXYTOCIN BOLUS FROM INFUSION
500.0000 mL | Freq: Once | INTRAVENOUS | Status: AC
  Administered 2019-03-27: 500 mL via INTRAVENOUS

## 2019-03-27 MED ORDER — TERBUTALINE SULFATE 1 MG/ML IJ SOLN: 0.2500 mg | Freq: Once | INTRAMUSCULAR | Status: DC | PRN

## 2019-03-27 MED ORDER — LACTATED RINGERS IV SOLN
500.0000 mL | INTRAVENOUS | Status: DC | PRN
  Administered 2019-03-27: 500 mL via INTRAVENOUS

## 2019-03-27 MED ORDER — LIDOCAINE HCL (PF) 1 % IJ SOLN
INTRAMUSCULAR | Status: DC | PRN
  Administered 2019-03-27: 10 mL via EPIDURAL

## 2019-03-27 MED ORDER — FENTANYL CITRATE (PF) 100 MCG/2ML IJ SOLN
50.0000 ug | Freq: Once | INTRAMUSCULAR | Status: AC
  Administered 2019-03-27: 50 ug via INTRAVENOUS

## 2019-03-27 MED ORDER — BUPIVACAINE HCL (PF) 0.25 % IJ SOLN
INTRAMUSCULAR | Status: DC | PRN
  Administered 2019-03-27 (×2): 5 mL via EPIDURAL

## 2019-03-27 MED ORDER — LIDOCAINE HCL (PF) 1 % IJ SOLN
30.0000 mL | INTRAMUSCULAR | Status: AC | PRN
  Administered 2019-03-27: 30 mL via SUBCUTANEOUS
  Filled 2019-03-27: qty 30

## 2019-03-27 MED ORDER — EPHEDRINE 5 MG/ML INJ: 10.0000 mg | INTRAVENOUS | Status: DC | PRN

## 2019-03-27 MED ORDER — OXYTOCIN 40 UNITS IN NORMAL SALINE INFUSION - SIMPLE MED
1.0000 m[IU]/min | INTRAVENOUS | Status: DC
  Administered 2019-03-27: 2 m[IU]/min via INTRAVENOUS
  Filled 2019-03-27: qty 1000

## 2019-03-27 MED ORDER — OXYCODONE-ACETAMINOPHEN 5-325 MG PO TABS: 2.0000 | ORAL_TABLET | ORAL | Status: DC | PRN

## 2019-03-27 MED ORDER — SODIUM CHLORIDE (PF) 0.9 % IJ SOLN
INTRAMUSCULAR | Status: DC | PRN
  Administered 2019-03-27: 14 mL/h via EPIDURAL

## 2019-03-27 MED ORDER — LIDOCAINE-EPINEPHRINE (PF) 2 %-1:200000 IJ SOLN
INTRAMUSCULAR | Status: DC | PRN
  Administered 2019-03-27: 3 mL via EPIDURAL

## 2019-03-27 MED ORDER — ACETAMINOPHEN 325 MG PO TABS: 650.0000 mg | ORAL_TABLET | ORAL | Status: DC | PRN

## 2019-03-27 MED ORDER — ONDANSETRON HCL 4 MG/2ML IJ SOLN
4.0000 mg | Freq: Four times a day (QID) | INTRAMUSCULAR | Status: DC | PRN
  Administered 2019-03-27: 4 mg via INTRAVENOUS
  Filled 2019-03-27: qty 2

## 2019-03-27 MED ORDER — PHENYLEPHRINE 40 MCG/ML (10ML) SYRINGE FOR IV PUSH (FOR BLOOD PRESSURE SUPPORT)
80.0000 ug | PREFILLED_SYRINGE | INTRAVENOUS | Status: DC | PRN
  Filled 2019-03-27: qty 10

## 2019-03-27 MED ORDER — BUTORPHANOL TARTRATE 1 MG/ML IJ SOLN: 1.0000 mg | INTRAMUSCULAR | Status: DC | PRN

## 2019-03-27 MED ORDER — OXYTOCIN 40 UNITS IN NORMAL SALINE INFUSION - SIMPLE MED: 2.5000 [IU]/h | INTRAVENOUS | Status: DC

## 2019-03-27 MED ORDER — LACTATED RINGERS IV SOLN
INTRAVENOUS | Status: DC
  Administered 2019-03-27 (×3): via INTRAVENOUS

## 2019-03-27 MED ORDER — OXYCODONE-ACETAMINOPHEN 5-325 MG PO TABS: 1.0000 | ORAL_TABLET | ORAL | Status: DC | PRN

## 2019-03-27 MED ORDER — DIPHENHYDRAMINE HCL 50 MG/ML IJ SOLN: 12.5000 mg | INTRAMUSCULAR | Status: DC | PRN

## 2019-03-27 MED ORDER — FENTANYL-BUPIVACAINE-NACL 0.5-0.125-0.9 MG/250ML-% EP SOLN
12.0000 mL/h | EPIDURAL | Status: DC | PRN
  Filled 2019-03-27: qty 250

## 2019-03-27 MED ORDER — FENTANYL CITRATE (PF) 100 MCG/2ML IJ SOLN
INTRAMUSCULAR | Status: AC
  Filled 2019-03-27: qty 2

## 2019-03-27 MED ORDER — SOD CITRATE-CITRIC ACID 500-334 MG/5ML PO SOLN: 30.0000 mL | ORAL | Status: DC | PRN

## 2019-03-27 MED ORDER — LACTATED RINGERS IV SOLN
500.0000 mL | Freq: Once | INTRAVENOUS | Status: AC
  Administered 2019-03-27: 500 mL via INTRAVENOUS

## 2019-03-27 MED ORDER — PHENYLEPHRINE 40 MCG/ML (10ML) SYRINGE FOR IV PUSH (FOR BLOOD PRESSURE SUPPORT): 80.0000 ug | PREFILLED_SYRINGE | INTRAVENOUS | Status: DC | PRN

## 2019-03-27 NOTE — Anesthesia Procedure Notes (Signed)
Epidural Patient location during procedure: OB Start time: 03/27/2019 1:17 PM End time: 03/27/2019 1:31 PM  Staffing Anesthesiologist: Lidia Collum, MD Performed: anesthesiologist   Preanesthetic Checklist Completed: patient identified, pre-op evaluation, timeout performed, IV checked, risks and benefits discussed and monitors and equipment checked  Epidural Patient position: sitting Prep: DuraPrep Patient monitoring: heart rate, continuous pulse ox and blood pressure Approach: midline Location: L2-L3 Injection technique: LOR air  Needle:  Needle type: Tuohy  Needle gauge: 17 G Needle length: 9 cm Needle insertion depth: 7 cm Catheter type: closed end flexible Catheter size: 19 Gauge Catheter at skin depth: 12 cm Test dose: negative  Assessment Events: blood not aspirated, injection not painful, no injection resistance, negative IV test and no paresthesia  Additional Notes Reason for block:procedure for pain

## 2019-03-27 NOTE — Anesthesia Preprocedure Evaluation (Signed)
Anesthesia Evaluation  Patient identified by MRN, date of birth, ID band Patient awake    Reviewed: Allergy & Precautions, H&P , NPO status , Patient's Chart, lab work & pertinent test results  History of Anesthesia Complications Negative for: history of anesthetic complications  Airway Mallampati: II  TM Distance: >3 FB Neck ROM: full    Dental no notable dental hx.    Pulmonary neg pulmonary ROS,    Pulmonary exam normal        Cardiovascular negative cardio ROS Normal cardiovascular exam Rhythm:regular Rate:Normal     Neuro/Psych negative neurological ROS  negative psych ROS   GI/Hepatic negative GI ROS, Neg liver ROS,   Endo/Other  Morbid obesity  Renal/GU negative Renal ROS  negative genitourinary   Musculoskeletal   Abdominal   Peds  Hematology negative hematology ROS (+)   Anesthesia Other Findings   Reproductive/Obstetrics (+) Pregnancy                             Anesthesia Physical Anesthesia Plan  ASA: III  Anesthesia Plan: Epidural   Post-op Pain Management:    Induction:   PONV Risk Score and Plan:   Airway Management Planned:   Additional Equipment:   Intra-op Plan:   Post-operative Plan:   Informed Consent: I have reviewed the patients History and Physical, chart, labs and discussed the procedure including the risks, benefits and alternatives for the proposed anesthesia with the patient or authorized representative who has indicated his/her understanding and acceptance.       Plan Discussed with:   Anesthesia Plan Comments:         Anesthesia Quick Evaluation  

## 2019-03-27 NOTE — Progress Notes (Signed)
Still feeling pain despite episural Afeb, VSS FHT- 130s, early decels, Cat I, ctx 1 2 min with pitocin off VE-6/70/-1, vtx Will see if can get her more comfortable with epidural, monitor progress, restart pitocin if ctx space out

## 2019-03-27 NOTE — Progress Notes (Signed)
Feeling ctx Afeb, VSS FHT- 130s, Cat I, ctx q 2-3 min VE-3/50/-2, vtx, AROM clear Continue pitocin, monitor progress, anticipate VBAC

## 2019-03-27 NOTE — H&P (Signed)
[name] is a 33 y.o. female, G3 P2002, EGA 39+ weeks with [facility] 6-21 presenting for elective induction.  Previous LTCS followed by VBAC.  Had intracervical foley placed yesterday afternoon, came out late last PM.  Prenatal care essentially uncomplicated, had 7 cm right ovarian cyst which has most likely resolved, isolated CP cyst.  OB History    Gravida  3   Para  2   Term  2   Preterm      AB      Living  2     SAB  0   TAB  0   Ectopic  0   Multiple      Live Births  2          Past Medical History:  Diagnosis Date  . Vaginal Pap smear, abnormal    Past Surgical History:  Procedure Laterality Date  . CESAREAN SECTION    . WISDOM TOOTH EXTRACTION     Family History: family history includes Diabetes in her maternal grandmother. Social History:  reports that she has never smoked. She has never used smokeless tobacco. She reports that she does not drink alcohol or use drugs.     Maternal Diabetes: No Genetic Screening: Normal Maternal Ultrasounds/Referrals: Isolated choroid plexus cyst Fetal Ultrasounds or other Referrals:  None Maternal Substance Abuse:  No Significant Maternal Medications:  None Significant Maternal Lab Results:  Group B Strep negative Other Comments:  None  Review of Systems  Respiratory: Negative.   Cardiovascular: Negative.    Maternal Medical History:  Contractions: Perceived severity is mild.    Fetal activity: Perceived fetal activity is normal.    Prenatal complications: no prenatal complications Prenatal Complications - Diabetes: none.    Dilation: 3 Effacement (%): 60 Station: Ballotable Exam by:: J.Follmer,RNC Blood pressure 114/87, pulse 94, temperature 98.2 F (36.8 C), temperature source Oral, resp. rate 15, height 5\' 2"  (1.575 m), weight 113.6 kg, last menstrual period 06/23/2018. Maternal Exam:  Uterine Assessment: Contraction strength is mild.  Contraction frequency is irregular.   Abdomen: Patient  reports no abdominal tenderness. Surgical scars: low transverse.   Estimated fetal weight is 8 lbs.   Fetal presentation: vertex  Introitus: Normal vulva. Normal vagina.  Amniotic fluid character: not assessed.  Pelvis: adequate for delivery.   Cervix: Cervix evaluated by digital exam.     Fetal Exam Fetal Monitor Review: Mode: ultrasound.   Baseline rate: 130.  Variability: moderate (6-25 bpm).   Pattern: accelerations present and no decelerations.    Fetal State Assessment: Category I - tracings are normal.     Physical Exam  Vitals reviewed. Constitutional: She appears well-developed and well-nourished.  Cardiovascular: Normal rate and regular rhythm.  Respiratory: Effort normal. No respiratory distress.  GI: Soft.  Genitourinary:    Vulva normal.     Prenatal labs: ABO, Rh: O/Positive/-- (11/20 0000) Antibody: Negative (11/20 0000) Rubella: Immune (11/20 0000) RPR: Nonreactive (11/20 0000)  HBsAg: Negative (11/20 0000)  HIV: Non-reactive (11/20 0000)  GBS: Negative (05/29 0000)   Assessment/Plan: IUP at 39+ weeks, previous LTCS and VBAC, admitted for elective induction.  Had intracervical foley for ripening, came out late last pm.  On pitocin, will monitor progress, anticipate VBAC.   Blane Ohara Newman Waren 03/27/2019, 8:58 AM

## 2019-03-27 NOTE — Progress Notes (Signed)
More comfortable after epidural redose Afeb, VSS FHT- 140s, Cat I, ctx q 2 min VE-7-8/80/0, vtx Monitor progress

## 2019-03-27 NOTE — Progress Notes (Signed)
Comfortable Afeb, VSS FHT- remains Cat I, ctx q 2 min VE-9/80/0, vtx, cervix mostly on right almost reduces Continue to monitor, recheck in one hour

## 2019-03-28 MED ORDER — COCONUT OIL OIL: 1.0000 "application " | TOPICAL_OIL | Status: DC | PRN

## 2019-03-28 MED ORDER — PRENATAL MULTIVITAMIN CH
1.0000 | ORAL_TABLET | Freq: Every day | ORAL | Status: DC
  Administered 2019-03-28 – 2019-03-29 (×2): 1 via ORAL
  Filled 2019-03-28 (×2): qty 1

## 2019-03-28 MED ORDER — SENNOSIDES-DOCUSATE SODIUM 8.6-50 MG PO TABS
2.0000 | ORAL_TABLET | ORAL | Status: DC
  Administered 2019-03-28 – 2019-03-29 (×2): 2 via ORAL
  Filled 2019-03-28 (×2): qty 2

## 2019-03-28 MED ORDER — OXYCODONE HCL 5 MG PO TABS: 10.0000 mg | ORAL_TABLET | ORAL | Status: DC | PRN

## 2019-03-28 MED ORDER — TETANUS-DIPHTH-ACELL PERTUSSIS 5-2.5-18.5 LF-MCG/0.5 IM SUSP: 0.5000 mL | Freq: Once | INTRAMUSCULAR | Status: DC

## 2019-03-28 MED ORDER — OXYCODONE HCL 5 MG PO TABS: 5.0000 mg | ORAL_TABLET | ORAL | Status: DC | PRN

## 2019-03-28 MED ORDER — SIMETHICONE 80 MG PO CHEW: 80.0000 mg | CHEWABLE_TABLET | ORAL | Status: DC | PRN

## 2019-03-28 MED ORDER — IBUPROFEN 600 MG PO TABS
600.0000 mg | ORAL_TABLET | Freq: Four times a day (QID) | ORAL | Status: DC
  Administered 2019-03-28 – 2019-03-29 (×8): 600 mg via ORAL
  Filled 2019-03-28 (×8): qty 1

## 2019-03-28 MED ORDER — ZOLPIDEM TARTRATE 5 MG PO TABS: 5.0000 mg | ORAL_TABLET | Freq: Every evening | ORAL | Status: DC | PRN

## 2019-03-28 MED ORDER — BENZOCAINE-MENTHOL 20-0.5 % EX AERO
1.0000 "application " | INHALATION_SPRAY | CUTANEOUS | Status: DC | PRN
  Administered 2019-03-28: 1 via TOPICAL
  Filled 2019-03-28: qty 56

## 2019-03-28 MED ORDER — MAGNESIUM HYDROXIDE 400 MG/5ML PO SUSP: 30.0000 mL | ORAL | Status: DC | PRN

## 2019-03-28 MED ORDER — METHYLERGONOVINE MALEATE 0.2 MG PO TABS: 0.2000 mg | ORAL_TABLET | ORAL | Status: DC | PRN

## 2019-03-28 MED ORDER — ACETAMINOPHEN 325 MG PO TABS
650.0000 mg | ORAL_TABLET | ORAL | Status: DC | PRN
  Administered 2019-03-28: 650 mg via ORAL
  Filled 2019-03-28: qty 2

## 2019-03-28 MED ORDER — ONDANSETRON HCL 4 MG PO TABS: 4.0000 mg | ORAL_TABLET | ORAL | Status: DC | PRN

## 2019-03-28 MED ORDER — METHYLERGONOVINE MALEATE 0.2 MG/ML IJ SOLN: 0.2000 mg | INTRAMUSCULAR | Status: DC | PRN

## 2019-03-28 MED ORDER — DIPHENHYDRAMINE HCL 25 MG PO CAPS: 25.0000 mg | ORAL_CAPSULE | Freq: Four times a day (QID) | ORAL | Status: DC | PRN

## 2019-03-28 MED ORDER — MEASLES, MUMPS & RUBELLA VAC IJ SOLR: 0.5000 mL | Freq: Once | INTRAMUSCULAR | Status: DC

## 2019-03-28 MED ORDER — DIBUCAINE (PERIANAL) 1 % EX OINT: 1.0000 "application " | TOPICAL_OINTMENT | CUTANEOUS | Status: DC | PRN

## 2019-03-28 MED ORDER — ONDANSETRON HCL 4 MG/2ML IJ SOLN: 4.0000 mg | INTRAMUSCULAR | Status: DC | PRN

## 2019-03-28 MED ORDER — WITCH HAZEL-GLYCERIN EX PADS: 1.0000 "application " | MEDICATED_PAD | CUTANEOUS | Status: DC | PRN

## 2019-03-28 NOTE — Anesthesia Postprocedure Evaluation (Signed)
Anesthesia Post Note  Patient: [name]  Procedure(s) Performed: AN AD HOC LABOR EPIDURAL     Patient location during evaluation: Mother Baby Anesthesia Type: Epidural Level of consciousness: awake Pain management: satisfactory to patient Vital Signs Assessment: post-procedure vital signs reviewed and stable Respiratory status: spontaneous breathing Cardiovascular status: stable Anesthetic complications: no    Last Vitals:  Vitals:   03/28/19 0100 03/28/19 0518  BP: (!) 106/58 (!) 92/41  Pulse: 94 84  Resp: 18 18  Temp: 37.4 C 36.6 C  SpO2: 98% 98%    Last Pain:  Vitals:   03/28/19 0518  TempSrc: Oral  PainSc: 4    Pain Goal:                   [employer]

## 2019-03-28 NOTE — Progress Notes (Signed)
Post Partum Day 1 Subjective: no complaints, up ad lib, voiding, tolerating PO and nl lochia, pain controlled  Objective: Blood pressure 126/74, pulse 86, temperature 97.8 F (36.6 C), temperature source Oral, resp. rate 20, height 5\' 2"  (1.575 m), weight 113.6 kg, last menstrual period 06/23/2018, SpO2 98 %, unknown if currently breastfeeding.  Physical Exam:  General: alert and no distress Lochia: appropriate Uterine Fundus: firm   Recent Labs    03/27/19 0751  HGB 12.6  HCT 36.6    Assessment/Plan: Plan for discharge tomorrow, Breastfeeding and Lactation consult.  Routine postpartum care.     LOS: 1 day   [name] 03/28/2019, 8:29 AM

## 2019-03-28 NOTE — Lactation Note (Signed)
This note was copied from a baby's chart. Lactation Consultation Note Baby 2 hrs old. Hadn't latched since birth. Mom is Breast/formula feeding. Mom has 32 yr old and 51 yr old that she didn't BF. Mom has "V" shaped soft breast, flat compressible nipples. Everts to short shaft w/finger stimulation and pre-pumping. Assisted latching in football position. Baby suckled intermittent approx. 3 minutes being on the breast approx. 10 min.  Newborn feeding habits, STS, I&O, positioning, support, comfort discussed. Mom has Gerber formula at bedside. Encouraged to BF before giving formula. Hand pump given to pre-pump before latching. Hand expression taught w/glisening of colostrum. Mom stated some breast changes during pregnancy.  Encouraged mom to call for assistance or questions. Lactation brochure given.  Patient Name: [name] Today's Date: 03/28/2019 Reason for consult: Initial assessment   Maternal Data    Feeding Feeding Type: Breast Fed  LATCH Score Latch: Repeated attempts needed to sustain latch, nipple held in mouth throughout feeding, stimulation needed to elicit sucking reflex.  Audible Swallowing: None  Type of Nipple: Flat  Comfort (Breast/Nipple): Soft / non-tender  Hold (Positioning): Assistance needed to correctly position infant at breast and maintain latch.  LATCH Score: 5  Interventions Interventions: Breast feeding basics reviewed;Adjust position;Assisted with latch;Support pillows;Skin to skin;Position options;Breast massage;Hand express;Pre-pump if needed;Hand pump  Lactation Tools Discussed/Used Tools: Pump Breast pump type: Manual WIC Program: Yes Pump Review: Setup, frequency, and cleaning Initiated by:: Allayne Stack RN IBCLC Date initiated:: 03/28/19   Consult Status Consult Status: Follow-up Date: 03/29/19 Follow-up type: In-patient    Theodoro Kalata 03/28/2019, 12:41 AM

## 2019-03-29 MED ORDER — IBUPROFEN 600 MG PO TABS: 600.0000 mg | ORAL_TABLET | Freq: Four times a day (QID) | ORAL | 1 refills | Status: DC | PRN

## 2019-03-29 NOTE — Discharge Summary (Signed)
OB Discharge Summary     Patient Name: [name] DOB: 1986-01-13 MRN: [id number]  Date of admission: 03/27/2019 Delivering MD: Jackelyn KnifeMEISINGER, TODD   Date of discharge: 03/29/2019  Admitting diagnosis: pregnancy Intrauterine pregnancy: 2864w4d     Secondary diagnosis:  Active Problems:   Indication for care in labor or delivery   VBAC, delivered, current hospitalization  Additional problems: none     Discharge diagnosis: Term Pregnancy Delivered and VBAC                                                                                                Post partum procedures:none  Augmentation: AROM, Pitocin and Foley Balloon  Complications: None  Hospital course:  Induction of Labor With Vaginal Delivery   33 y.o. yo G3P3003 at 2964w4d was admitted to the hospital 03/27/2019 for induction of labor.  Indication for induction: elective.  Patient had an uncomplicated labor course as follows: Membrane Rupture Time/Date: 12:30 PM ,03/27/2019   Intrapartum Procedures: Episiotomy: None [1]                                         Lacerations:  1st degree [2];Labial [10]  Patient had delivery of a Viable infant.  Information for the patient's newborn:  Peri JeffersonGood, Girl Lelon MastSamantha [[id number]][[id number]]      03/27/2019  Details of delivery can be found in separate delivery note.  Patient had a routine postpartum course. Patient is discharged home 03/29/19.  Physical exam  Vitals:   03/28/19 1423 03/28/19 1928 03/28/19 2132 03/29/19 0500  BP: (!) 123/57 120/60 (!) 132/93 (!) 99/54  Pulse: 85 88 91 73  Resp: 20 16 17 15   Temp: 98.5 F (36.9 C) 99.2 F (37.3 C) 99.9 F (37.7 C) 97.8 F (36.6 C)  TempSrc: Oral Oral Oral Oral  SpO2: 99% 99%  100%  Weight:      Height:       General: alert, cooperative and no distress Lochia: appropriate Uterine Fundus: firm Incision: N/A DVT Evaluation: No evidence of DVT seen on physical exam. Calf/Ankle edema is present Labs: Lab Results  Component Value Date    WBC 12.1 (H) 03/27/2019   HGB 12.6 03/27/2019   HCT 36.6 03/27/2019   MCV 89.7 03/27/2019   PLT 244 03/27/2019   CMP Latest Ref Rng & Units 08/11/2018  Glucose 70 - 99 mg/dL 086(V126(H)  BUN 6 - 20 mg/dL 6  Creatinine 7.840.44 - 6.961.00 mg/dL 2.950.44  Sodium 284135 - 132145 mmol/L 137  Potassium 3.5 - 5.1 mmol/L 3.0(L)  Chloride 98 - 111 mmol/L 105  CO2 22 - 32 mmol/L 22  Calcium 8.9 - 10.3 mg/dL 4.4(W8.8(L)  Total Protein 6.5 - 8.1 g/dL 6.8  Total Bilirubin 0.3 - 1.2 mg/dL 0.9  Alkaline Phos 38 - 126 U/L 55  AST 15 - 41 U/L 18  ALT 0 - 44 U/L 21    Discharge instruction: per After Visit Summary and "Baby and Me Booklet".  After visit meds:  Allergies  as of 03/29/2019   No Known Allergies     Medication List    TAKE these medications   fluticasone 50 MCG/ACT nasal spray Commonly known as: FLONASE Place 2 sprays into both nostrils daily.   ibuprofen 600 MG tablet Commonly known as: ADVIL Take 1 tablet (600 mg total) by mouth every 6 (six) hours as needed for moderate pain or cramping.   prenatal multivitamin Tabs tablet Take 1 tablet by mouth daily at 12 noon.       Diet: routine diet  Activity: Advance as tolerated. Pelvic rest for 6 weeks.   Outpatient follow up:6 weeks Follow up Appt:No future appointments. Follow up Visit:No follow-ups on file.  Postpartum contraception: Not Discussed  Newborn Data: Live born female  Birth Weight: 6 lb 11.4 oz (3045 g) APGAR: 7, 9  Newborn Delivery   Birth date/time: 03/27/2019 22:24:00 Delivery type: Vaginal, Vacuum (Extractor)      Baby Feeding: Breast Disposition:home with mother   03/29/2019 Isaiah Serge, DO

## 2019-03-29 NOTE — Progress Notes (Addendum)
Patient ID: [name], female   DOB: 1986-03-05, 33 y.o.   MRN: [id number] Pt is doing well. She reports pain well controlled, lochia mild, no HA/SOB or CP. She is bonding well with baby. Breastfeeding well. Ambulating and voiding well. She feels ready for discharge to home today VSS ABD - FF and 3cm below umbilicus EXT - +0NUUVO in ankles, no homans  A/P: PPD#2 s/p VAVD - successful VBAC - stable         Discharge instructions reviewed ; postpartum appointment in 6 weeks

## 2019-03-29 NOTE — Lactation Note (Signed)
This note was copied from a baby's chart. Lactation Consultation Note Baby 22 hrs old. Mom is breast/formula feeding.  Reviewed w/mom giving formula after BF and not BF. Gave information sheet of amount of formula to give. Mom is to increase amount of formula when not BF. Mom cont. To give pacifier when has been advised to wait a couple of weeks. Reviewed engorgement, breast care, pumping, and milk storage. Mom states she feels confident about going home w/baby today BF and formula feeding. Reviewed supply and demand and importance of I&O. Reminded of [facility] OP services if needed.  Patient Name: [name] [id number] Date: 03/29/2019 Reason for consult: Follow-up assessment   Maternal Data    Feeding    LATCH Score                   Interventions    Lactation Tools Discussed/Used     Consult Status Consult Status: Complete Date: 03/29/19    Theodoro Kalata 03/29/2019, 6:14 AM

## 2019-03-29 NOTE — Lactation Note (Signed)
This note was copied from a baby's chart. Lactation Consultation Note:  Mother reports that infant is breastfeeding well. She denies feeling any discomfort when infant is at the breast. Mother is formula feeding as well.  Mother is active with [facility]. She reports that [facility] is supposed to phone her today.   Discussed S/S of Mastitis and advised mother to follow up with MD if any signs.   Infant maybe a baby patient today. A repeat bili with be done at noon and will determine discharge.  Mother is aware of available [facility] services and community support.   Patient Name: [name] [id number] Date: 03/29/2019 Reason for consult: Follow-up assessment   Maternal Data    Feeding Feeding Type: Formula Nipple Type: Slow - flow  LATCH Score                   Interventions    Lactation Tools Discussed/Used WIC Program: ([place] to phone her today )   Consult Status Consult Status: PRN Date: 03/29/19    Jess Barters [facility] 03/29/2019, 10:06 AM

## 2019-03-29 NOTE — Discharge Instructions (Signed)
Call office with any concerns [phone] 

## 2019-03-30 ENCOUNTER — Ambulatory Visit: Payer: Self-pay

## 2019-03-30 NOTE — Lactation Note (Signed)
This note was copied from a baby's chart. Lactation Consultation Note  Patient Name: [name] [id number] Date: 03/30/2019 Reason for consult: Follow-up assessment;Term;Infant weight loss;Hyperbilirubinemia P3, 13 hour female infant with high bilirubin of 16.4 at 55 hours.  This is mom's first time breastfeeding she attempted with other two children but stopped within a few days due to lack of support. Per mom, infant been latch mostly for 10 minutes. LC discussed ways to keep infant awake at breast mom will work towards increase breast duration to 20 minutes or longer . Infant had 14 stools and two stools while LC was in the room and 2 voids diapers since birth. LC reviewed hand expression and mom expressed 5 ml of colostrum which she will offer infant after breast feeding  and supplement with formula based on infant's age/ hours of life. LC notice mom has flat nipples, discussed with mom to do breast stimulation, pre-pump with hand pump and wear breast shells to help evert nipple shaft out more to help infant latch. Mom attempted to latch infant on left breast using football but infant did not sustain latch with this position, mom switch to cross cradle hold and infant latched with wide mouth gape, nose and chin to breast and sustained latch for 8 minutes and was still breastfeeding when LC left the room. Mom plans to use DEBP, pump every 3 hours for 15 minutes on initial setting to help with breast stimulation and induction. Mom shown how to use DEBP & how to disassemble, clean, & reassemble parts. Mom has DEBP at home. Mom will breastfeed according hunger cues, 8 to 12 times within 24 hours and breastfeed on demand. Mom knows to call Nurse or [facility] if she has any questions, concerns or need assistance with latching infant to breast.    Maternal Data Formula Feeding for Exclusion: Yes Reason for exclusion: Mother's choice to formula and breast feed on admission  Feeding    LATCH  Score Latch: Grasps breast easily, tongue down, lips flanged, rhythmical sucking.  Audible Swallowing: A few with stimulation  Type of Nipple: Flat  Comfort (Breast/Nipple): Soft / non-tender  Hold (Positioning): Assistance needed to correctly position infant at breast and maintain latch.  LATCH Score: 7  Interventions Interventions: Assisted with latch;Pre-pump if needed;Shells;Hand express;Support pillows;Position options;Adjust position;Hand pump;DEBP;Breast compression  Lactation Tools Discussed/Used     Consult Status Consult Status: Follow-up Date: 03/30/19 Follow-up type: In-patient    Vicente Serene 03/30/2019, 6:45 AM

## 2019-03-31 ENCOUNTER — Ambulatory Visit: Payer: Self-pay

## 2019-03-31 NOTE — Lactation Note (Signed)
This note was copied from a baby's chart. Lactation Consultation Note  Patient Name: [name] [id number] Date: 03/31/2019 Reason for consult: Follow-up assessment;Hyperbilirubinemia;Term Baby is 82 hours/3% weight loss.  Mom is mostly formula feeding.  She is not pumping.  Baby going to breast occasionally and mom reports no difficulty with latch.  Breasts are comfortable.  Reviewed pump with mom and encouraged her to pump every 3 hours to establish and maintain her milk supply.  Reviewed the benefits of supplementing with her breast milk.  Baby remains on phototherapy.  Possible discharge tomorrow.  Encouraged to call for assist/concerns.  Maternal Data    Feeding    LATCH Score                   Interventions    Lactation Tools Discussed/Used     Consult Status Consult Status: Follow-up Date: 04/01/19 Follow-up type: In-patient    Ave Filter 03/31/2019, 9:12 AM

## 2019-07-10 ENCOUNTER — Other Ambulatory Visit: Payer: Self-pay

## 2019-07-10 ENCOUNTER — Encounter (HOSPITAL_BASED_OUTPATIENT_CLINIC_OR_DEPARTMENT_OTHER): Payer: Self-pay | Admitting: *Deleted

## 2019-07-10 NOTE — Progress Notes (Signed)
Spoke w/ via phone for pre-op interview---[name] needs dos----   Cbc, urine pregnancy             COVID test ------07-11-2019 Arrive at -------730 am 07-15-2019 NPO after ------midnight Medications to take morning of surgery -----none Diabetic medication -----n/a Patient Special Instructions ----- Pre-Op special Istructions ----- Patient verbalized understanding of instructions that were given at this phone interview. Patient denies shortness of breath, chest pain, fever, cough a this phone interview.

## 2019-07-11 ENCOUNTER — Other Ambulatory Visit (HOSPITAL_COMMUNITY)
Admission: RE | Admit: 2019-07-11 | Discharge: 2019-07-11 | Disposition: A | Discharge status: Home / Self Care | Payer: Medicaid Other | Source: Ambulatory Visit | Attending: Obstetrics and Gynecology | Admitting: Obstetrics and Gynecology

## 2019-07-11 DIAGNOSIS — Z20828 Contact with and (suspected) exposure to other viral communicable diseases: Secondary | ICD-10-CM | POA: Insufficient documentation

## 2019-07-11 DIAGNOSIS — Z01812 Encounter for preprocedural laboratory examination: Secondary | ICD-10-CM | POA: Diagnosis present

## 2019-07-12 LAB — NOVEL CORONAVIRUS, NAA (HOSP ORDER, SEND-OUT TO REF LAB; TAT 18-24 HRS): SARS-CoV-2, NAA: NOT DETECTED

## 2019-07-14 NOTE — H&P (Signed)
[name] is an 33 y.o. female. She is s/p VBAC on 6-17, had Mirena placed 7-31 at postpartum exam.  No problems with IUD, desires permanent sterility.  Pertinent Gynecological History: Last pap: normal Date: 07-25-2017 OB History: G3, P3003   Menstrual History: Patient's last menstrual period was 07/01/2019.    Past Medical History:  Diagnosis Date  . Medical history non-contributory   . Vaginal Pap smear, abnormal     Past Surgical History:  Procedure Laterality Date  . CESAREAN SECTION    . WISDOM TOOTH EXTRACTION      Family History  Problem Relation Age of Onset  . Diabetes Maternal Grandmother     Social History:  reports that she has never smoked. She has never used smokeless tobacco. She reports that she does not drink alcohol or use drugs.  Allergies: No Known Allergies  No medications prior to admission.    Review of Systems  Respiratory: Negative.   Cardiovascular: Negative.     Height 5\' 2"  (1.575 m), weight 108 kg, last menstrual period 07/01/2019, unknown if currently breastfeeding. Physical Exam  Constitutional: She appears well-developed and well-nourished.  Cardiovascular: Normal rate, regular rhythm and normal heart sounds.  No murmur heard. Respiratory: Effort normal and breath sounds normal. No respiratory distress. She has no wheezes.  GI: Soft. She exhibits no distension and no mass. There is no abdominal tenderness.  Genitourinary:    Vagina and uterus normal.     No results found for this or any previous visit (from the past 24 hour(s)).  No results found.  Assessment/Plan: Desires permanent sterility.  Discussed all options for contraception, currently has Mirena in place.  Discussed surgical procedure, risks, failure rate.  Will admit for laparoscopic BTL, will leave Mirena in place to help with menses  Blane Ohara Manual [place] 07/14/2019, 10:40 AM

## 2019-07-15 ENCOUNTER — Ambulatory Visit (HOSPITAL_BASED_OUTPATIENT_CLINIC_OR_DEPARTMENT_OTHER)
Admission: RE | Admit: 2019-07-15 | Discharge: 2019-07-15 | Disposition: A | Discharge status: Home / Self Care | Payer: Medicaid Other | Source: Ambulatory Visit | Attending: Obstetrics and Gynecology | Admitting: Obstetrics and Gynecology

## 2019-07-15 ENCOUNTER — Encounter (HOSPITAL_BASED_OUTPATIENT_CLINIC_OR_DEPARTMENT_OTHER): Payer: Self-pay | Admitting: Anesthesiology

## 2019-07-15 ENCOUNTER — Encounter (HOSPITAL_BASED_OUTPATIENT_CLINIC_OR_DEPARTMENT_OTHER)
Admission: RE | Disposition: A | Discharge status: Home / Self Care | Payer: Self-pay | Source: Ambulatory Visit | Attending: Obstetrics and Gynecology

## 2019-07-15 ENCOUNTER — Ambulatory Visit (HOSPITAL_BASED_OUTPATIENT_CLINIC_OR_DEPARTMENT_OTHER): Payer: Medicaid Other | Admitting: Anesthesiology

## 2019-07-15 DIAGNOSIS — Z302 Encounter for sterilization: Secondary | ICD-10-CM | POA: Insufficient documentation

## 2019-07-15 DIAGNOSIS — Z6841 Body Mass Index (BMI) 40.0 and over, adult: Secondary | ICD-10-CM | POA: Diagnosis not present

## 2019-07-15 HISTORY — DX: Other specified health status: Z78.9

## 2019-07-15 HISTORY — PX: LAPAROSCOPIC BILATERAL SALPINGECTOMY: SHX5889

## 2019-07-15 LAB — CBC
HCT: 40.8 % (ref 36.0–46.0)
Hemoglobin: 13.4 g/dL (ref 12.0–15.0)
MCH: 30.3 pg (ref 26.0–34.0)
MCHC: 32.8 g/dL (ref 30.0–36.0)
MCV: 92.3 fL (ref 80.0–100.0)
Platelets: 307 10*3/uL (ref 150–400)
RBC: 4.42 MIL/uL (ref 3.87–5.11)
RDW: 12.7 % (ref 11.5–15.5)
WBC: 7.5 10*3/uL (ref 4.0–10.5)
nRBC: 0 % (ref 0.0–0.2)

## 2019-07-15 LAB — POCT PREGNANCY, URINE: Preg Test, Ur: NEGATIVE

## 2019-07-15 SURGERY — SALPINGECTOMY, BILATERAL, LAPAROSCOPIC
Anesthesia: General | Laterality: Bilateral

## 2019-07-15 MED ORDER — FENTANYL CITRATE (PF) 100 MCG/2ML IJ SOLN
INTRAMUSCULAR | Status: DC | PRN
  Administered 2019-07-15 (×4): 50 ug via INTRAVENOUS

## 2019-07-15 MED ORDER — ROCURONIUM BROMIDE 10 MG/ML (PF) SYRINGE
PREFILLED_SYRINGE | INTRAVENOUS | Status: AC
  Filled 2019-07-15: qty 10

## 2019-07-15 MED ORDER — ONDANSETRON HCL 4 MG/2ML IJ SOLN
INTRAMUSCULAR | Status: DC | PRN
  Administered 2019-07-15: 4 mg via INTRAVENOUS

## 2019-07-15 MED ORDER — LIDOCAINE HCL (CARDIAC) PF 100 MG/5ML IV SOSY
PREFILLED_SYRINGE | INTRAVENOUS | Status: DC | PRN
  Administered 2019-07-15: 100 mg via INTRAVENOUS

## 2019-07-15 MED ORDER — MIDAZOLAM HCL 5 MG/5ML IJ SOLN
INTRAMUSCULAR | Status: DC | PRN
  Administered 2019-07-15: 2 mg via INTRAVENOUS

## 2019-07-15 MED ORDER — HYDROCODONE-ACETAMINOPHEN 5-325 MG PO TABS: 1.0000 | ORAL_TABLET | Freq: Four times a day (QID) | ORAL | 0 refills | Status: DC | PRN

## 2019-07-15 MED ORDER — KETOROLAC TROMETHAMINE 30 MG/ML IJ SOLN
INTRAMUSCULAR | Status: DC | PRN
  Administered 2019-07-15: 30 mg via INTRAVENOUS

## 2019-07-15 MED ORDER — FENTANYL CITRATE (PF) 100 MCG/2ML IJ SOLN
INTRAMUSCULAR | Status: AC
  Filled 2019-07-15: qty 2

## 2019-07-15 MED ORDER — PROPOFOL 10 MG/ML IV BOLUS
INTRAVENOUS | Status: AC
  Filled 2019-07-15: qty 40

## 2019-07-15 MED ORDER — DEXAMETHASONE SODIUM PHOSPHATE 4 MG/ML IJ SOLN
INTRAMUSCULAR | Status: DC | PRN
  Administered 2019-07-15: 10 mg via INTRAVENOUS

## 2019-07-15 MED ORDER — ROCURONIUM BROMIDE 100 MG/10ML IV SOLN
INTRAVENOUS | Status: DC | PRN
  Administered 2019-07-15: 50 mg via INTRAVENOUS

## 2019-07-15 MED ORDER — ONDANSETRON HCL 4 MG/2ML IJ SOLN
4.0000 mg | Freq: Once | INTRAMUSCULAR | Status: DC | PRN
  Filled 2019-07-15: qty 2

## 2019-07-15 MED ORDER — LIDOCAINE 2% (20 MG/ML) 5 ML SYRINGE
INTRAMUSCULAR | Status: AC
  Filled 2019-07-15: qty 5

## 2019-07-15 MED ORDER — PROPOFOL 10 MG/ML IV BOLUS
INTRAVENOUS | Status: DC | PRN
  Administered 2019-07-15: 200 mg via INTRAVENOUS

## 2019-07-15 MED ORDER — BUPIVACAINE HCL (PF) 0.25 % IJ SOLN
INTRAMUSCULAR | Status: DC | PRN
  Administered 2019-07-15: 10 mL

## 2019-07-15 MED ORDER — LACTATED RINGERS IV SOLN
INTRAVENOUS | Status: DC
  Filled 2019-07-15: qty 1000

## 2019-07-15 MED ORDER — DEXAMETHASONE SODIUM PHOSPHATE 10 MG/ML IJ SOLN
INTRAMUSCULAR | Status: AC
  Filled 2019-07-15: qty 1

## 2019-07-15 MED ORDER — OXYCODONE HCL 5 MG/5ML PO SOLN
5.0000 mg | Freq: Once | ORAL | Status: DC | PRN
  Filled 2019-07-15: qty 5

## 2019-07-15 MED ORDER — LACTATED RINGERS IV SOLN
INTRAVENOUS | Status: DC
  Administered 2019-07-15 (×3): via INTRAVENOUS
  Filled 2019-07-15: qty 1000

## 2019-07-15 MED ORDER — SCOPOLAMINE 1 MG/3DAYS TD PT72
MEDICATED_PATCH | TRANSDERMAL | Status: DC | PRN
  Administered 2019-07-15: 1 via TRANSDERMAL

## 2019-07-15 MED ORDER — ONDANSETRON HCL 4 MG/2ML IJ SOLN
INTRAMUSCULAR | Status: AC
  Filled 2019-07-15: qty 2

## 2019-07-15 MED ORDER — SCOPOLAMINE 1 MG/3DAYS TD PT72
MEDICATED_PATCH | TRANSDERMAL | Status: AC
  Filled 2019-07-15: qty 1

## 2019-07-15 MED ORDER — OXYCODONE HCL 5 MG PO TABS
5.0000 mg | ORAL_TABLET | Freq: Once | ORAL | Status: DC | PRN
  Filled 2019-07-15: qty 1

## 2019-07-15 MED ORDER — MIDAZOLAM HCL 2 MG/2ML IJ SOLN
INTRAMUSCULAR | Status: AC
  Filled 2019-07-15: qty 2

## 2019-07-15 MED ORDER — FENTANYL CITRATE (PF) 100 MCG/2ML IJ SOLN
25.0000 ug | INTRAMUSCULAR | Status: DC | PRN
  Filled 2019-07-15: qty 1

## 2019-07-15 MED ORDER — SUGAMMADEX SODIUM 200 MG/2ML IV SOLN
INTRAVENOUS | Status: DC | PRN
  Administered 2019-07-15: 250 mg via INTRAVENOUS

## 2019-07-15 MED ORDER — KETOROLAC TROMETHAMINE 30 MG/ML IJ SOLN
INTRAMUSCULAR | Status: AC
  Filled 2019-07-15: qty 1

## 2019-07-15 SURGICAL SUPPLY — 31 items
BAG RETRIEVAL 10MM (BASKET)
CATH ROBINSON RED A/P 16FR (CATHETERS) ×3 IMPLANT
COVER WAND RF STERILE (DRAPES) ×3 IMPLANT
DERMABOND ADVANCED (GAUZE/BANDAGES/DRESSINGS) ×2
DERMABOND ADVANCED .7 DNX12 (GAUZE/BANDAGES/DRESSINGS) ×1 IMPLANT
DRSG COVADERM PLUS 2X2 (GAUZE/BANDAGES/DRESSINGS) IMPLANT
DRSG OPSITE POSTOP 3X4 (GAUZE/BANDAGES/DRESSINGS) IMPLANT
DURAPREP 26ML APPLICATOR (WOUND CARE) ×3 IMPLANT
GAUZE 4X4 16PLY RFD (DISPOSABLE) ×3 IMPLANT
GLOVE BIOGEL PI IND STRL 8 (GLOVE) ×1 IMPLANT
GLOVE BIOGEL PI INDICATOR 8 (GLOVE) ×2
GLOVE ORTHO TXT STRL SZ7.5 (GLOVE) ×3 IMPLANT
GOWN STRL REUS W/TWL XL LVL3 (GOWN DISPOSABLE) ×3 IMPLANT
NEEDLE INSUFFLATION 120MM (ENDOMECHANICALS) ×3 IMPLANT
NS IRRIG 1000ML POUR BTL (IV SOLUTION) ×3 IMPLANT
PACK LAPAROSCOPY BASIN (CUSTOM PROCEDURE TRAY) ×3 IMPLANT
PACK TRENDGUARD 450 HYBRID PRO (MISCELLANEOUS) IMPLANT
PROTECTOR NERVE ULNAR (MISCELLANEOUS) ×6 IMPLANT
SET IRRIG TUBING LAPAROSCOPIC (IRRIGATION / IRRIGATOR) IMPLANT
SHEARS HARMONIC ACE PLUS 36CM (ENDOMECHANICALS) IMPLANT
SUT VICRYL 0 UR6 27IN ABS (SUTURE) ×2 IMPLANT
SUT VICRYL 4-0 PS2 18IN ABS (SUTURE) ×3 IMPLANT
SYS BAG RETRIEVAL 10MM (BASKET)
SYSTEM BAG RETRIEVAL 10MM (BASKET) IMPLANT
TOWEL OR 17X26 10 PK STRL BLUE (TOWEL DISPOSABLE) ×3 IMPLANT
TRENDGUARD 450 HYBRID PRO PACK (MISCELLANEOUS) ×3
TROCAR BLADELESS OPT 5 100 (ENDOMECHANICALS) ×5 IMPLANT
TROCAR XCEL BLUNT TIP 100MML (ENDOMECHANICALS) IMPLANT
TROCAR XCEL NON-BLD 11X100MML (ENDOMECHANICALS) ×2 IMPLANT
TUBING EVAC SMOKE HEATED PNEUM (TUBING) ×3 IMPLANT
WARMER LAPAROSCOPE (MISCELLANEOUS) ×3 IMPLANT

## 2019-07-15 NOTE — Op Note (Signed)
Preoperative diagnosis: Desires surgical sterility Postoperative diagnosis: Same Procedure: Laparoscopic bilateral tubal fulguration Surgeon: Cheri Fowler M.D. Anesthesia: Gen. Endotracheal tube Findings: She had a normal pelvis   Estimated blood loss: Minimal Complications: None  Procedure in detail: The patient was taken to the operating room and placed in the dorsosupine position. General anesthesia was induced. Her left arm was tucked to her side and legs were placed in mobile stirrups. Abdomen was then prepped and draped in the usual sterile fashion, bladder drained with a red Robinson catheter, Hulka tenaculum applied to the cervix for uterine manipulation. Infraumbilical skin was then infiltrated with quarter percent Marcaine and a 1 cm vertical incision was made. The Veress needle was inserted into the peritoneal cavity and placement confirmed by the water drop test an opening pressure of 6 mm of mercury. CO2 was insufflated to a pressure of 14 mm of mercury and the Veress needle was removed. A 10/11 disposable trocar was then introduced with direct visualization with the laparoscope. The operating scope was then inserted. [place] visualization was achieved, inspection revealed the above-mentioned findings. Both fallopian tubes were easily identified and traced to their fimbriated ends. A 3 cm portion of the middle of each tube was fulgurated with bipolar cautery until the amp meter read 0 in all spots along a 3 cm segment. This was done on both sides with Laymon fulguration of both tubes and Kluge hemostasis. No complications were encountered. The laparoscope was removed. All gas was allowed to deflate from the abdomen and the trocar was removed. Fascia was approximated with one suture of 0 Vicryl. Skin incision was closed with interrupted subcuticular sutures of 4-0 Vicryl followed by Dermabond. The Hulka tenaculum was removed. The patient was awakened in the operating room and taken to the recovery  in stable condition after tolerating the procedure well. Counts were correct and she had PAS hose on throughout the procedure.

## 2019-07-15 NOTE — Anesthesia Postprocedure Evaluation (Signed)
Anesthesia Post Note  Patient: [name]  Procedure(s) Performed: LAPAROSCOPIC BILATERAL SALPINGECTOMY (Bilateral )     Patient location during evaluation: PACU Anesthesia Type: General Level of consciousness: awake and alert Pain management: pain level controlled Vital Signs Assessment: post-procedure vital signs reviewed and stable Respiratory status: spontaneous breathing, nonlabored ventilation and respiratory function stable Cardiovascular status: blood pressure returned to baseline and stable Postop Assessment: no apparent nausea or vomiting Anesthetic complications: no    Last Vitals:  Vitals:   07/15/19 1115 07/15/19 1200  BP: 119/74 136/78  Pulse: 82   Resp: (!) 27 18  Temp:  36.6 C  SpO2: 95% 95%    Last Pain:  Vitals:   07/15/19 1200  TempSrc:   PainSc: 1                  Lidia Collum

## 2019-07-15 NOTE — Discharge Instructions (Signed)
Routine instructions for laparoscopy  DISCHARGE INSTRUCTIONS: Laparoscopy  The following instructions have been prepared to help you care for yourself upon your return home today.  Wound care:  Do not get the incision wet for the first 24 hours. The incision should be kept clean and dry.  The Band-Aids or dressings may be removed the day after surgery.  Should the incision become sore, red, and swollen after the first week, check with your doctor.  Personal hygiene:  Shower the day after your procedure.  Activity and limitations:  Do NOT drive or operate any equipment today.  Do NOT lift anything more than 15 pounds for 2-3 weeks after surgery.  Do NOT rest in bed all day.  Walking is encouraged. Walk each day, starting slowly with 5-minute walks 3 or 4 times a day. Slowly increase the length of your walks.  Walk up and down stairs slowly.  Do NOT do strenuous activities, such as golfing, playing tennis, bowling, running, biking, weight lifting, gardening, mowing, or vacuuming for 2-4 weeks. Ask your doctor when it is okay to start.  Diet: Eat a light meal as desired this evening. You may resume your usual diet tomorrow.  Return to work: This is dependent on the type of work you do. For the most part you can return to a desk job within a week of surgery. If you are more active at work, please discuss this with your doctor.  What to expect after your surgery: You may have a slight burning sensation when you urinate on the first day. You may have a very small amount of blood in the urine. Expect to have a small amount of vaginal discharge/light bleeding for 1-2 weeks. It is not unusual to have abdominal soreness and bruising for up to 2 weeks. You may be tired and need more rest for about 1 week. You may experience shoulder pain for 24-72 hours. Warm heating pad may help with discomfort and move around.  Call your doctor for any of the following:  Develop a fever of 100.4 or  greater  Inability to urinate 6 hours after discharge from hospital  Severe pain not relieved by pain medications  Persistent of heavy bleeding at incision site  Redness or swelling around incision site after a week  Increasing nausea or vomiting   Post Anesthesia Home Care Instructions  Activity: Get plenty of rest for the remainder of the day. A responsible individual must stay with you for 24 hours following the procedure.  For the next 24 hours, DO NOT: -Drive a car -Paediatric nurse -Drink alcoholic beverages -Take any medication unless instructed by your physician -Make any legal decisions or sign important papers.  Meals: Start with liquid foods such as gelatin or soup. Progress to regular foods as tolerated. Avoid greasy, spicy, heavy foods. If nausea and/or vomiting occur, drink only clear liquids until the nausea and/or vomiting subsides. Call your physician if vomiting continues.  Special Instructions/Symptoms: Your throat may feel dry or sore from the anesthesia or the breathing tube placed in your throat during surgery. If this causes discomfort, gargle with warm salt water. The discomfort should disappear within 24 hours.  If you had a scopolamine patch placed behind your ear for the management of post- operative nausea and/or vomiting:  1. The medication in the patch is effective for 72 hours, after which it should be removed.  Wrap patch in a tissue and discard in the trash. Wash hands thoroughly with soap and water. 2.  You may remove the patch earlier than 72 hours if you experience unpleasant side effects which may include dry mouth, dizziness or visual disturbances. 3. Avoid touching the patch. Wash your hands with soap and water after contact with the patch.

## 2019-07-15 NOTE — Transfer of Care (Signed)
Immediate Anesthesia Transfer of Care Note  Patient: [name]  Procedure(s) Performed: Procedure(s) (LRB): LAPAROSCOPIC BILATERAL SALPINGECTOMY (Bilateral)  Patient Location: PACU  Anesthesia Type: General  Level of Consciousness: awake, sedated, patient cooperative and responds to stimulation  Airway & Oxygen Therapy: Patient Spontanous Breathing and Patient connected to [place] O2 and soft FM  Post-op Assessment: Report given to PACU RN, Post -op Vital signs reviewed and stable and Patient moving all extremities  Post vital signs: Reviewed and stable  Complications: No apparent anesthesia complications

## 2019-07-15 NOTE — Anesthesia Preprocedure Evaluation (Addendum)
Anesthesia Evaluation  Patient identified by MRN, date of birth, ID band Patient awake    Reviewed: Allergy & Precautions, NPO status , Patient's Chart, lab work & pertinent test results  History of Anesthesia Complications Negative for: history of anesthetic complications  Airway Mallampati: II  TM Distance: >3 FB Neck ROM: Full    Dental  (+) Teeth Intact   Pulmonary neg pulmonary ROS,    Pulmonary exam normal        Cardiovascular negative cardio ROS Normal cardiovascular exam     Neuro/Psych negative neurological ROS  negative psych ROS   GI/Hepatic negative GI ROS, Neg liver ROS,   Endo/Other  Morbid obesity  Renal/GU negative Renal ROS  negative genitourinary   Musculoskeletal negative musculoskeletal ROS (+)   Abdominal   Peds  Hematology negative hematology ROS (+)   Anesthesia Other Findings   Reproductive/Obstetrics negative OB ROS                            Anesthesia Physical Anesthesia Plan  ASA: III  Anesthesia Plan: General   Post-op Pain Management:    Induction: Intravenous  PONV Risk Score and Plan: Ondansetron, Dexamethasone, Treatment may vary due to age or medical condition and Midazolam  Airway Management Planned: Oral ETT  Additional Equipment: None  Intra-op Plan:   Post-operative Plan: Extubation in OR  Informed Consent: I have reviewed the patients History and Physical, chart, labs and discussed the procedure including the risks, benefits and alternatives for the proposed anesthesia with the patient or authorized representative who has indicated his/her understanding and acceptance.     Dental advisory given  Plan Discussed with:   Anesthesia Plan Comments:        Anesthesia Quick Evaluation

## 2019-07-15 NOTE — Interval H&P Note (Signed)
History and Physical Interval Note:  07/15/2019 9:13 AM  [name]  has presented today for surgery, with the diagnosis of sterilization.  The various methods of treatment have been discussed with the patient and family. After consideration of risks, benefits and other options for treatment, the patient has consented to  Procedure(s): LAPAROSCOPIC BILATERAL SALPINGECTOMY (Bilateral) as a surgical intervention.  The patient's history has been reviewed, patient examined, no change in status, stable for surgery.  I have reviewed the patient's chart and labs.  Questions were answered to the patient's satisfaction.     Blane Ohara Senie Lanese

## 2019-07-15 NOTE — Anesthesia Procedure Notes (Signed)
Procedure Name: Intubation Date/Time: 07/15/2019 9:36 AM Performed by: Justice Rocher, CRNA Pre-anesthesia Checklist: Patient identified, Emergency Drugs available, Suction available and Patient being monitored Patient Re-evaluated:Patient Re-evaluated prior to induction Oxygen Delivery Method: Circle system utilized Preoxygenation: Pre-oxygenation with 100% oxygen Induction Type: IV induction Ventilation: Mask ventilation without difficulty Laryngoscope Size: Mac and 4 Grade View: Grade III Tube type: Oral Tube size: 7.5 mm Number of attempts: 2 Airway Equipment and Method: Stylet,  Oral airway and Video-laryngoscopy Placement Confirmation: ETT inserted through vocal cords under direct vision,  positive ETCO2 and breath sounds checked- equal and bilateral Secured at: 23 cm Tube secured with: Tape Dental Injury: Teeth and Oropharynx as per pre-operative assessment

## 2019-07-16 ENCOUNTER — Encounter (HOSPITAL_BASED_OUTPATIENT_CLINIC_OR_DEPARTMENT_OTHER): Payer: Self-pay | Admitting: Obstetrics and Gynecology

## 2019-12-11 ENCOUNTER — Ambulatory Visit (HOSPITAL_COMMUNITY)
Admission: EM | Admit: 2019-12-11 | Discharge: 2019-12-11 | Disposition: A | Discharge status: Home / Self Care | Payer: Medicaid Other | Attending: Family Medicine | Admitting: Family Medicine

## 2019-12-11 ENCOUNTER — Other Ambulatory Visit: Payer: Self-pay

## 2019-12-11 ENCOUNTER — Encounter (HOSPITAL_COMMUNITY): Payer: Self-pay | Admitting: Emergency Medicine

## 2019-12-11 DIAGNOSIS — Z202 Contact with and (suspected) exposure to infections with a predominantly sexual mode of transmission: Secondary | ICD-10-CM

## 2019-12-11 DIAGNOSIS — R030 Elevated blood-pressure reading, without diagnosis of hypertension: Secondary | ICD-10-CM | POA: Diagnosis present

## 2019-12-11 LAB — HIV ANTIBODY (ROUTINE TESTING W REFLEX): HIV Screen 4th Generation wRfx: NONREACTIVE

## 2019-12-11 MED ORDER — CEFTRIAXONE SODIUM 500 MG IJ SOLR
500.0000 mg | Freq: Once | INTRAMUSCULAR | Status: AC
  Administered 2019-12-11: 500 mg via INTRAMUSCULAR

## 2019-12-11 MED ORDER — DOXYCYCLINE HYCLATE 100 MG PO CAPS: 100.0000 mg | ORAL_CAPSULE | Freq: Two times a day (BID) | ORAL | 0 refills | Status: DC

## 2019-12-11 MED ORDER — CEFTRIAXONE SODIUM 500 MG IJ SOLR
INTRAMUSCULAR | Status: AC
  Filled 2019-12-11: qty 500

## 2019-12-11 NOTE — ED Triage Notes (Signed)
Patient states partner has gonorrhea.  Patient denies any symptoms, no vaginal discharge

## 2019-12-11 NOTE — Discharge Instructions (Addendum)
You have been given the following today for treatment of suspected gonorrhea and/or chlamydia:  cefTRIAXone (ROCEPHIN) injection 500 mg  Please pick up your prescription for doxycycline 100 mg and begin taking twice daily for the next seven (7) days.  Even though we have treated you today, we have sent testing for sexually transmitted infections. We will notify you of any positive results once they are received. If required, we will prescribe any medications you might need.  Please refrain from all sexual activity for at least the next seven days.   Your blood pressure was noted to be borderline high during your visit today. You may return here within the next few days to recheck if unable to see your primary care doctor.  BP (!) 145/92 (BP Location: Left Arm) Comment (BP Location): regular cuff on left forearm  Pulse 81   Temp 98.2 F (36.8 C) (Oral)   Resp 20   LMP 11/19/2019   SpO2 100%

## 2019-12-11 NOTE — ED Provider Notes (Addendum)
[facility]   [id number] 12/11/19 Arrival Time: [place]:  1. Possible exposure to STD   2. Elevated blood pressure reading without diagnosis of hypertension      Discharge Instructions     You have been given the following today for treatment of suspected gonorrhea and/or chlamydia:  cefTRIAXone (ROCEPHIN) injection 500 mg  Please pick up your prescription for doxycycline 100 mg and begin taking twice daily for the next seven (7) days.  Even though we have treated you today, we have sent testing for sexually transmitted infections. We will notify you of any positive results once they are received. If required, we will prescribe any medications you might need.  Please refrain from all sexual activity for at least the next seven days.   Your blood pressure was noted to be borderline high during your visit today. You may return here within the next few days to recheck if unable to see your primary care doctor.  BP (!) 145/92 (BP Location: Left Arm) Comment (BP Location): regular cuff on left forearm  Pulse 81   Temp 98.2 F (36.8 C) (Oral)   Resp 20   LMP 11/19/2019   SpO2 100%      Without s/s of PID.  Labs Reviewed  RPR  HIV ANTIBODY (ROUTINE TESTING W REFLEX)  CERVICOVAGINAL ANCILLARY ONLY    Will notify of any positive results. Instructed to refrain from sexual activity for at least seven days.  Reviewed expectations re: course of current medical issues. Questions answered. Outlined signs and symptoms indicating need for more acute intervention. Patient verbalized understanding. After Visit Summary given.   SUBJECTIVE:  [name] is a 34 y.o. female who reports possible exposure to STD; thinks gonorrhea. Partner states positive. Patient reports no symptoms including vaginal discharge. Afebrile. No abdominal or pelvic pain. Normal PO intake wihout n/v. No genital rashes or lesions. Reports that she is sexually active with both  female and female partners. History of STI: Chart review by me shows:  Component 1 yr ago  Chlamydia **POSITIVE**Abnormal    Comment: Normal Reference Range - Negative  Neisseria Gonorrhea Negative   Comment: Normal Reference Range - Negative  Treated.  Patient's last menstrual period was 11/19/2019.  Increased blood pressure noted today. Reports that she has not been treated for hypertension in the past. She reports no chest pain on exertion, no dyspnea on exertion, no swelling of ankles, no orthostatic dizziness or lightheadedness, no orthopnea or paroxysmal nocturnal dyspnea, no palpitations.   OBJECTIVE:  Vitals:   12/11/19 1542  BP: (!) 145/92  Pulse: 81  Resp: 20  Temp: 98.2 F (36.8 C)  TempSrc: Oral  SpO2: 100%     General appearance: alert, cooperative, appears stated age and no distress Lungs: unlabored respirations; speaks full sentences without difficulty Back: no CVA tenderness; FROM at waist Abdomen: soft GU: deferred Skin: warm and dry Psychological: alert and cooperative; normal mood and affect.    Labs Reviewed  RPR  HIV ANTIBODY (ROUTINE TESTING W REFLEX)  CERVICOVAGINAL ANCILLARY ONLY    No Known Allergies  Past Medical History:  Diagnosis Date  . Medical history non-contributory   . Vaginal Pap smear, abnormal    Family History  Problem Relation Age of Onset  . Diabetes Maternal Grandmother    Social History   Socioeconomic History  . Marital status: Single    Spouse name: Not on file  . Number of children: Not on file  . Years  of education: Not on file  . Highest education level: Not on file  Occupational History  . Not on file  Tobacco Use  . Smoking status: Never Smoker  . Smokeless tobacco: Never Used  Substance and Sexual Activity  . Alcohol use: No  . Drug use: No  . Sexual activity: Yes    Birth control/protection: I.U.D.  Other Topics Concern  . Not on file  Social History Narrative  . Not on file   Social  Determinants of Health   Financial Resource Strain: Low Risk   . Difficulty of Paying Living Expenses: Not hard at all  Food Insecurity: No Food Insecurity  . Worried About Programme researcher, broadcasting/film/video in the Last Year: Never true  . Ran Out of Food in the Last Year: Never true  Transportation Needs: Unknown  . Lack of Transportation (Medical): No  . Lack of Transportation (Non-Medical): Not on file  Physical Activity:   . Days of Exercise per Week: Not on file  . Minutes of Exercise per Session: Not on file  Stress: No Stress Concern Present  . Feeling of Stress : Not at all  Social Connections:   . Frequency of Communication with Friends and Family: Not on file  . Frequency of Social Gatherings with Friends and Family: Not on file  . Attends Religious Services: Not on file  . Active Member of Clubs or Organizations: Not on file  . Attends Banker Meetings: Not on file  . Marital Status: Not on file  Intimate Partner Violence: Not At Risk  . Fear of Current or Ex-Partner: No  . Emotionally Abused: No  . Physically Abused: No  . Sexually Abused: No          Mardella Layman, MD 12/11/19 1606    Mardella Layman, MD 12/11/19 [phone]

## 2019-12-12 LAB — RPR: RPR Ser Ql: NONREACTIVE

## 2019-12-13 ENCOUNTER — Telehealth (HOSPITAL_COMMUNITY): Payer: Self-pay | Admitting: Emergency Medicine

## 2019-12-13 LAB — CERVICOVAGINAL ANCILLARY ONLY
Bacterial vaginitis: POSITIVE — AB
Candida vaginitis: POSITIVE — AB
Chlamydia: NEGATIVE
Neisseria Gonorrhea: NEGATIVE
Trichomonas: NEGATIVE

## 2019-12-13 MED ORDER — METRONIDAZOLE 500 MG PO TABS: 500.0000 mg | ORAL_TABLET | Freq: Two times a day (BID) | ORAL | 0 refills | Status: AC

## 2019-12-13 MED ORDER — FLUCONAZOLE 150 MG PO TABS: 150.0000 mg | ORAL_TABLET | Freq: Once | ORAL | 0 refills | Status: AC

## 2019-12-13 NOTE — Telephone Encounter (Signed)
Bacterial vaginosis is positive. Pt needs treatment. Flagyl 500 mg BID x 7 days #14 no refills sent to patients pharmacy of choice.   Test for candida (yeast) was positive.  Prescription for fluconazole 150mg po now, repeat dose in 3d if needed, #2 no refills, sent to the pharmacy of record.  Recheck or followup with PCP for further evaluation if symptoms are not improving.    Patient contacted by phone and made aware of    results. Pt verbalized understanding and had all questions answered.  

## 2019-12-30 ENCOUNTER — Ambulatory Visit: Payer: Medicaid Other | Admitting: Podiatry

## 2019-12-30 ENCOUNTER — Other Ambulatory Visit: Payer: Self-pay

## 2019-12-30 ENCOUNTER — Ambulatory Visit (INDEPENDENT_AMBULATORY_CARE_PROVIDER_SITE_OTHER): Payer: Medicaid Other

## 2019-12-30 DIAGNOSIS — M216X1 Other acquired deformities of right foot: Secondary | ICD-10-CM | POA: Diagnosis not present

## 2019-12-30 DIAGNOSIS — B353 Tinea pedis: Secondary | ICD-10-CM

## 2019-12-30 DIAGNOSIS — M659 Synovitis and tenosynovitis, unspecified: Secondary | ICD-10-CM

## 2019-12-30 MED ORDER — CLOTRIMAZOLE-BETAMETHASONE 1-0.05 % EX CREA: 1.0000 "application " | TOPICAL_CREAM | Freq: Two times a day (BID) | CUTANEOUS | 3 refills | Status: DC

## 2019-12-30 MED ORDER — TERBINAFINE HCL 250 MG PO TABS: 250.0000 mg | ORAL_TABLET | Freq: Every day | ORAL | 0 refills | Status: DC

## 2020-01-01 NOTE — Progress Notes (Signed)
   Subjective:  34 y.o. female presenting today as a new patient with a chief complaint of dry, cracked skin to the plantar feet bilaterally that began over one year ago. She has been using OTC fungal cream and foot masks for treatment. She denies modifying factors.  She also reports soreness of the right ankle that began in 2010 after a fall. She reports associated intermittent swelling of the ankle. Being on her feet increases the symptoms. She has been icing and elevating the area as well as taking Ibuprofen and Tylenol for treatment. Patient is here for further evaluation and treatment.   Past Medical History:  Diagnosis Date  . Medical history non-contributory   . Vaginal Pap smear, abnormal      Objective / Physical Exam:  General:  The patient is alert and oriented x3 in no acute distress. Dermatology:  Pruritus noted to the bilateral feet with hyperkeratosis. Skin is warm, dry and supple bilateral lower extremities. Negative for open lesions or macerations. Vascular:  Palpable pedal pulses bilaterally. No edema or erythema noted. Capillary refill within normal limits. Neurological:  Epicritic and protective threshold grossly intact bilaterally.  Musculoskeletal Exam:  Pain on palpation to the medial aspect of the patient's right ankle. Mild edema noted. Range of motion within normal limits to all pedal and ankle joints bilateral. Muscle strength 5/5 in all groups bilateral.   Radiographic Exam:  Normal osseous mineralization. Joint spaces preserved. No fracture/dislocation/boney destruction.    Assessment: 1. Tinea pedis bilateral  2. Right ankle synovitis - medial   Plan of Care:  1. Patient was evaluated. X-Rays reviewed.  2. Injection of 0.5 mL Celestone Soluspan injected in the patient's right ankle. 3. Recommended [place] shoe gear.  4. Prescription for Lamisil 250 mg #30 provided to patient.  5. Prescription for Lotrisone cream provided to patient.  6. Return to  clinic as needed.    [name], DPM [facility]  Dr. Felecia Hodges, DPM    [address]                                        [place], [place] [address]                Office [phone]  Fax [phone]

## 2020-06-10 IMAGING — US US MFM OB FOLLOW-UP
1 series · 13 of 28 positions shown · non-contrast
Comparison: none

[Series 1: us mfm ob follow-up · 13 of 38 slices shown]
[im 2/38]
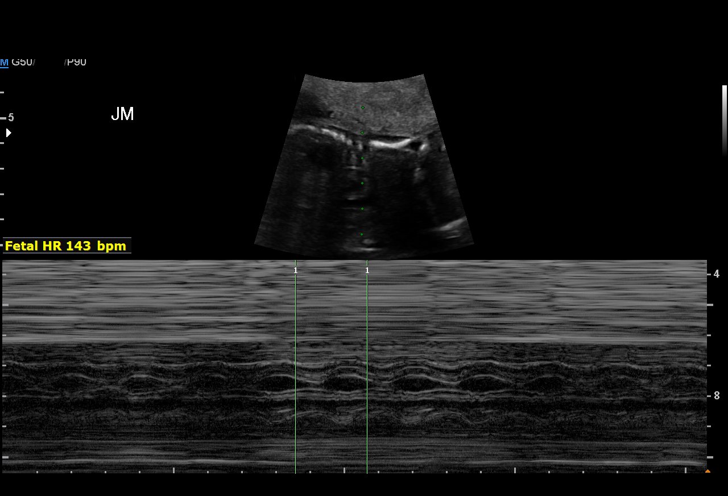
[im 5/38]
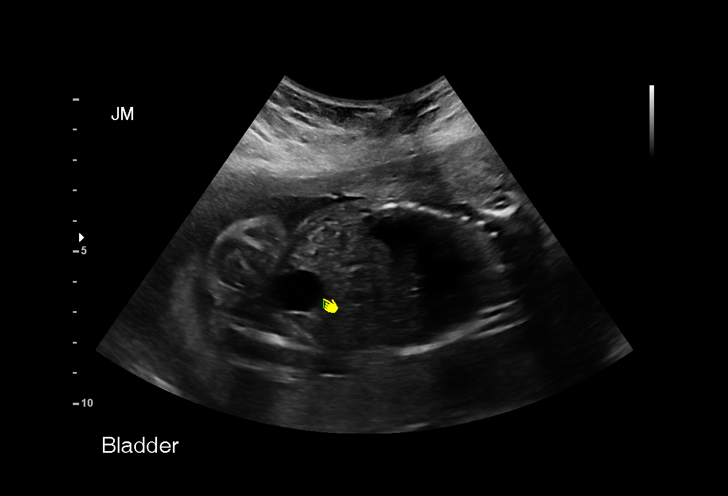
[im 7/38]
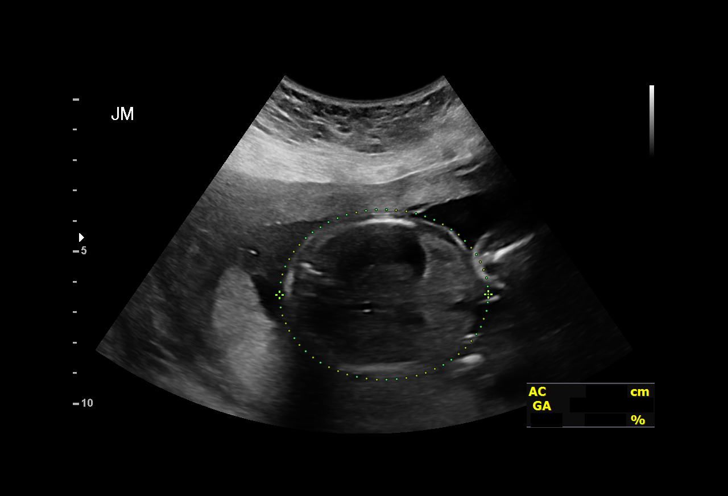
[im 10/38]
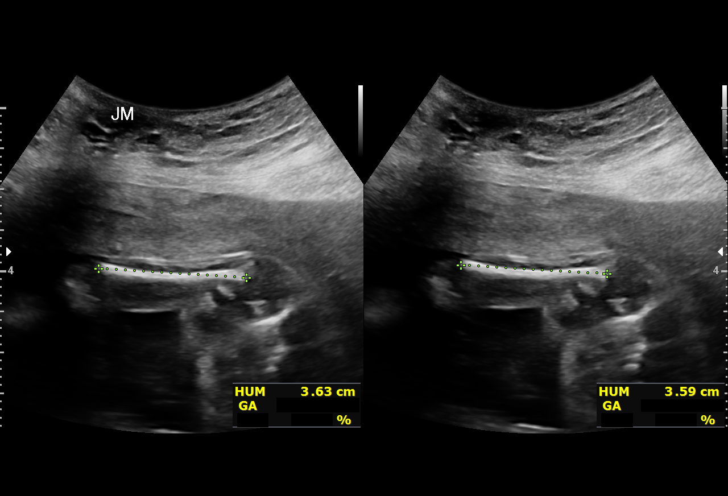
[im 13/38]
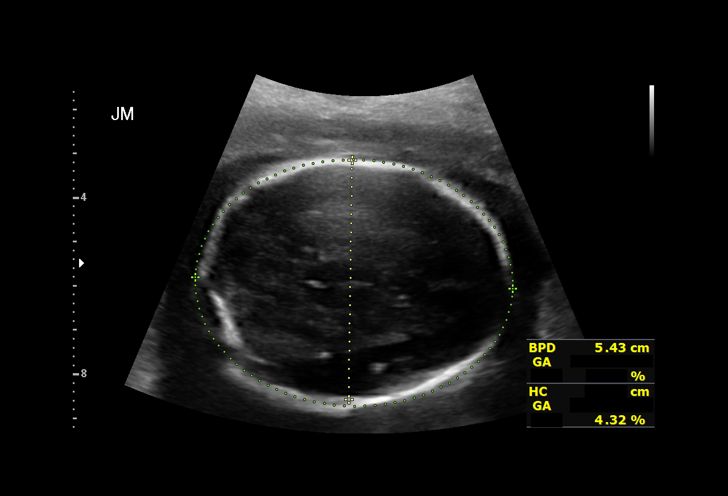
[im 16/38]
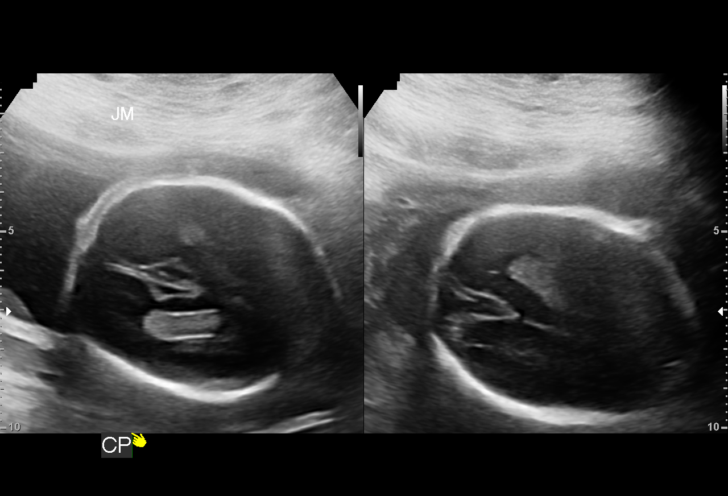
[im 20/38]
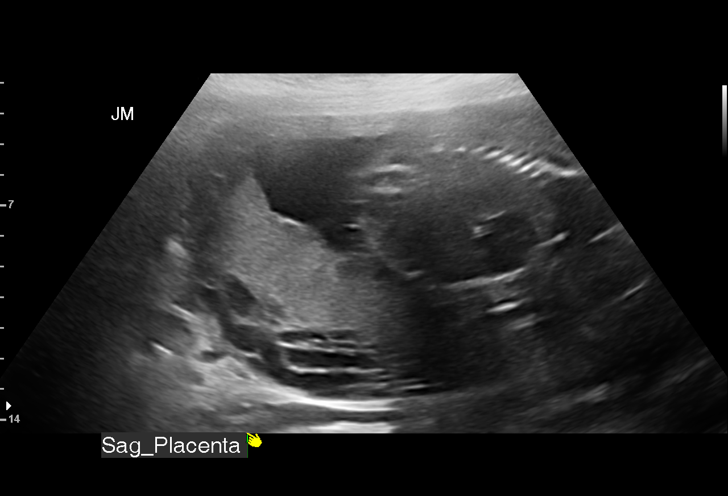
[im 22/38]
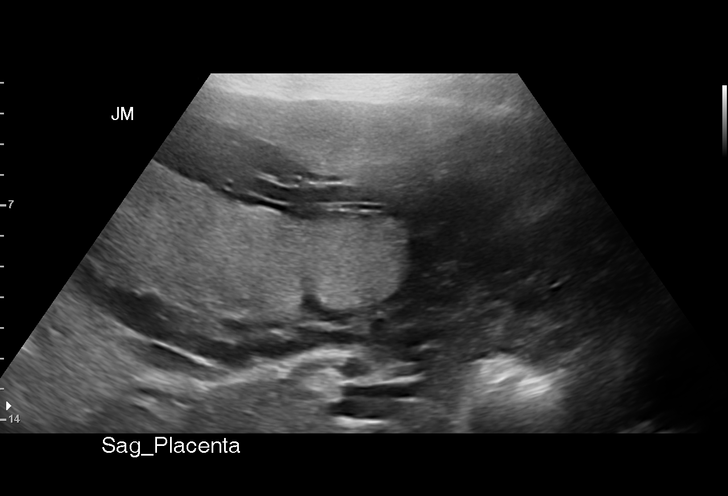
[im 25/38]
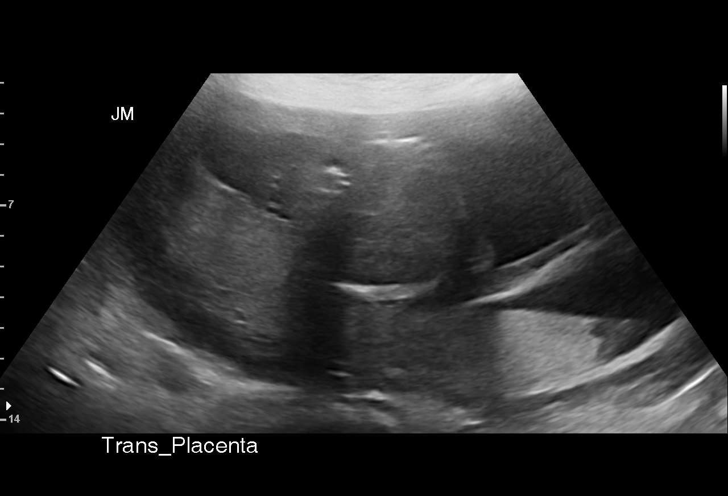
[im 28/38]
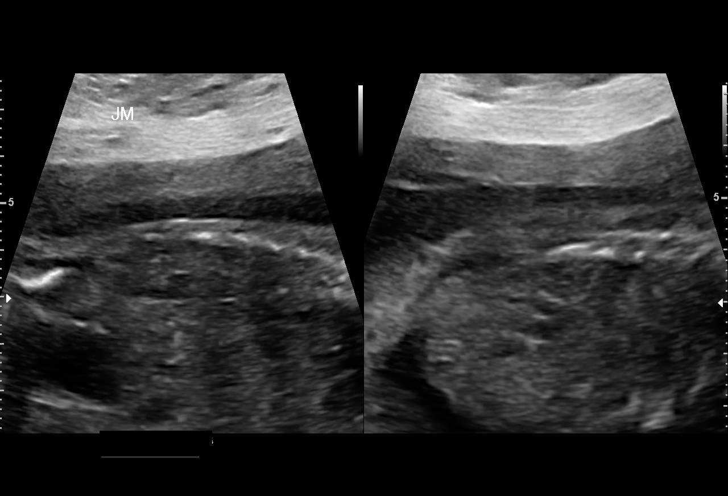
[im 31/38]
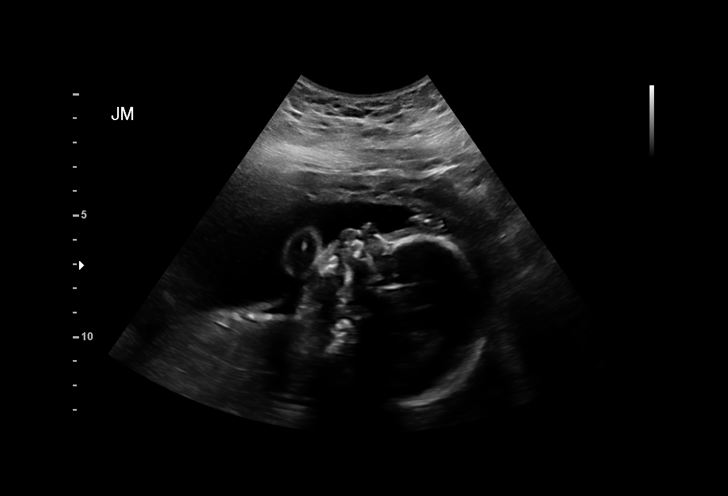
[im 33/38]
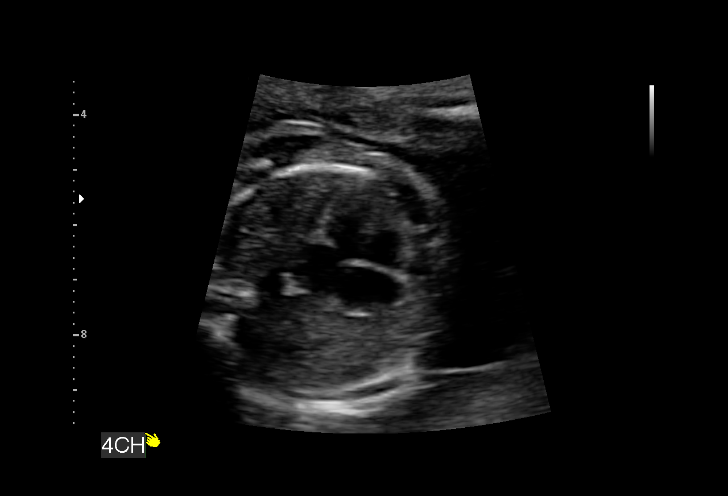
[im 36/38]
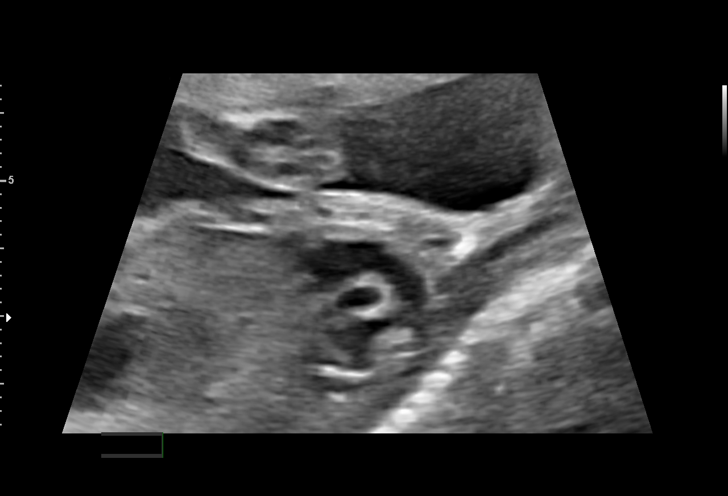

[13 of 28 positions shown; findings below may reference images not displayed]

[HOSPITAL],
                                                             [facility]

                                                       [facility]
 ----------------------------------------------------------------------

 ----------------------------------------------------------------------
Indications

  23 weeks gestation of pregnancy
  Maternal morbid obesity BMI 46
  Previous cesarean delivery, antepartum
  Encounter for other antenatal screening
  follow-up
 ----------------------------------------------------------------------
Fetal Evaluation

 Num Of Fetuses:          1
 Fetal Heart Rate(bpm):   143
 Cardiac Activity:        Observed
 Presentation:            Cephalic
 Placenta:                Posterior
 P. Cord Insertion:       Previously seen as normal

 Amniotic Fluid
 AFI FV:      Within normal limits

                             Largest Pocket(cm)

Biometry

 BPD:      54.3  mm     G. Age:  22w 4d         12  %    CI:        69.96   %    70 - 86
                                                         FL/HC:       17.4  %    18.7 -
 HC:      207.1  mm     G. Age:  22w 6d         11  %    HC/AC:       1.05       1.05 -
 AC:      196.5  mm     G. Age:  24w 2d         65  %    FL/BPD:      66.3  %    71 - 87
 FL:         36  mm     G. Age:  21w 3d        < 3  %    FL/AC:       18.3  %    20 - 24
 HUM:        36  mm     G. Age:  22w 4d         18  %

 Est. FW:     554   gm     1 lb 4 oz     40  %
OB History

 Gravidity:    3         Term:   2
 Living:       2
Gestational Age

 LMP:           23w 4d        Date:  06/23/18                 EDD:   03/30/19
 U/S Today:     22w 6d                                        EDD:   04/04/19
 Best:          23w 4d     Det. By:  LMP  (06/23/18)          EDD:   03/30/19
Anatomy

 Cranium:               Appears normal         Aortic Arch:            Previously seen
 Cavum:                 Previously seen        Ductal Arch:            Previously seen
 Ventricles:            Appears normal         Diaphragm:              Previously seen
 Choroid Plexus:        Bil choroid plexus     Stomach:                Appears normal, left
                        cysts resolved
                                                                       sided
 Cerebellum:            Previously seen        Abdomen:                Previously seen
 Posterior Fossa:       Previously seen        Abdominal Wall:         Previously seen
 Nuchal Fold:           Previously seen        Cord Vessels:           Previously seen
 Face:                  Orbits and profile     Kidneys:                Appear normal
                        previously seen
 Lips:                  Previously seen        Bladder:                Appears normal
 Palate:                Previously seen        Spine:                  Previously seen
 Heart:                 Appears normal         Upper Extremities:      Previously seen
                        (4CH, axis, and situs
 RVOT:                  Appears normal         Lower Extremities:      Previously seen
 LVOT:                  Appears normal

 Other:  Parents do not wish to know sex of fetus. Heels and 5th digit
         visualized previously. Nasal bone visualized previously.  Technically
         difficult due to maternal habitus and fetal position.
Cervix Uterus Adnexa

 Cervix
 Normal appearance by transabdominal scan.

 Uterus
 No abnormality visualized.

 Left Ovary
 No adnexal mass visualized.

 Right Ovary
 No adnexal mass visualized.

 Cul De Sac
 No free fluid seen.

 Adnexa
 No abnormality visualized.
Impression

 Normal interval growth.
 Suboptimal views of the fetal anatomy were obtained
 Obesity
Recommendations

 Follow up growth in 4 weeks.

## 2020-06-15 ENCOUNTER — Ambulatory Visit: Payer: Self-pay

## 2020-08-18 IMAGING — US US MFM OB FOLLOW UP
1 series · 14 of 28 positions shown · non-contrast
Comparison: none

[Series 1: us mfm ob follow up · 30 acquisitions, 14 frames shown]
[im 2/30]
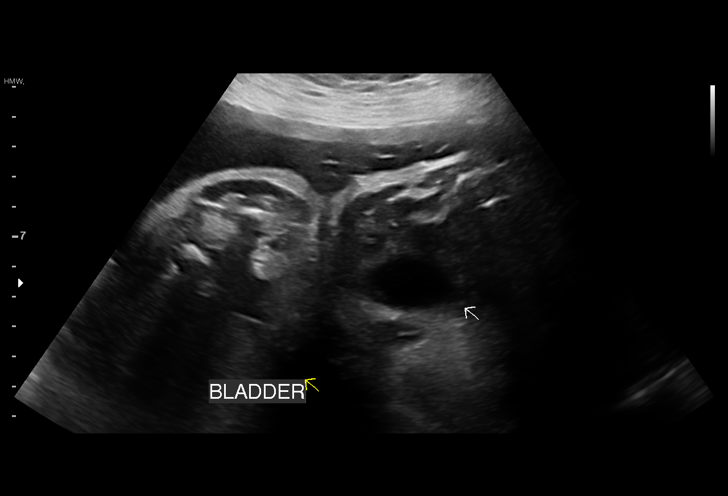
[im 4/30]
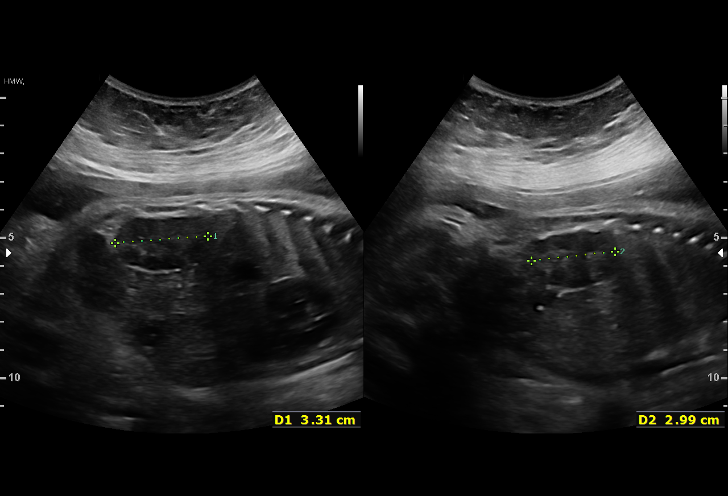
[im 6/30]
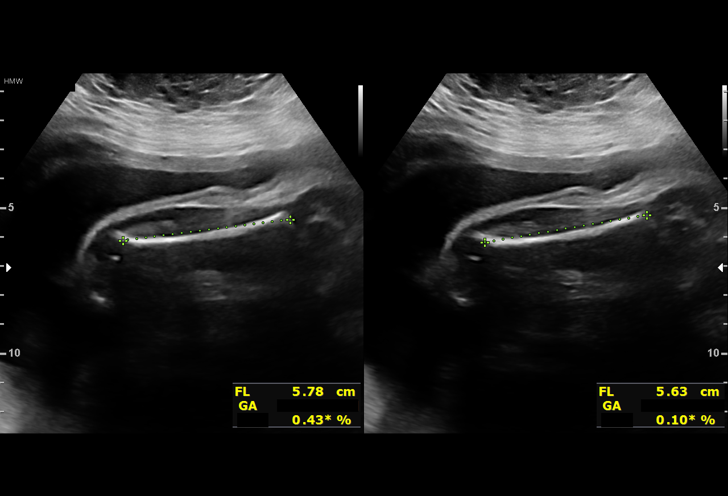
[im 8/30]
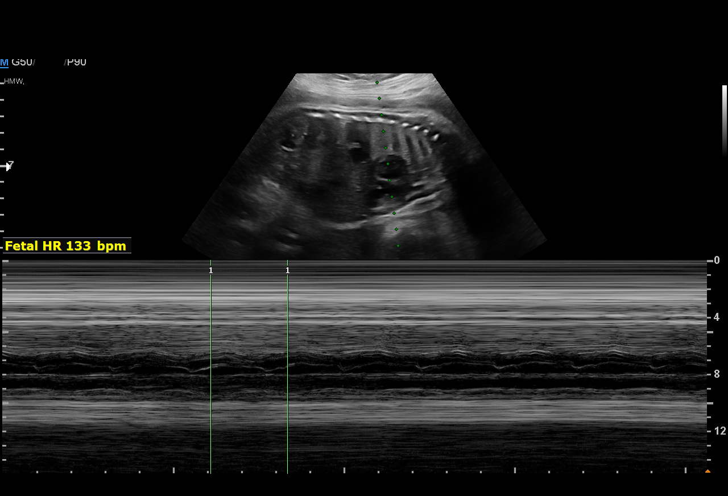
[im 10/30]
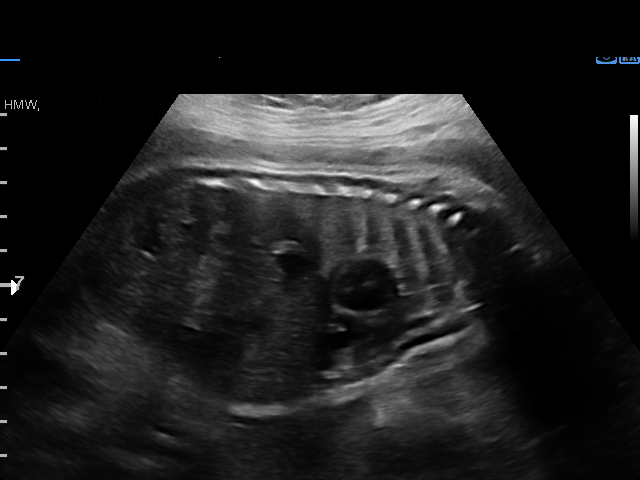
[im 12/30]
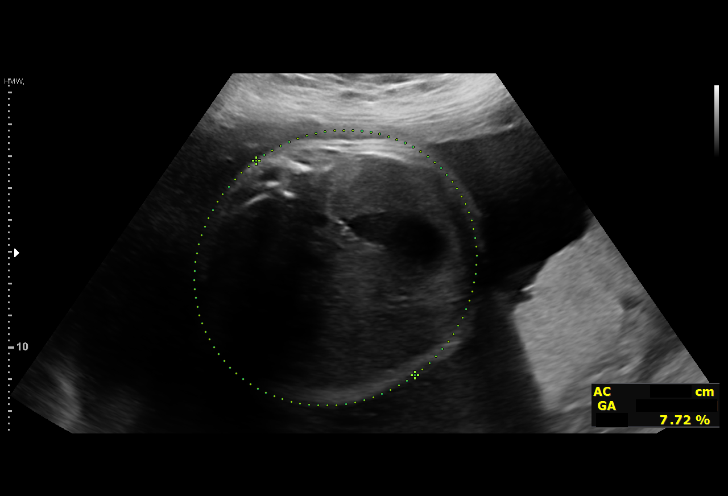
[im 14/30]
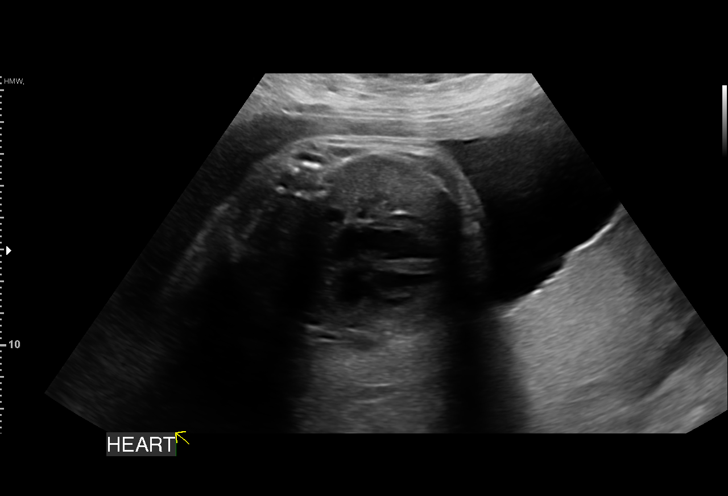
[im 17/30]
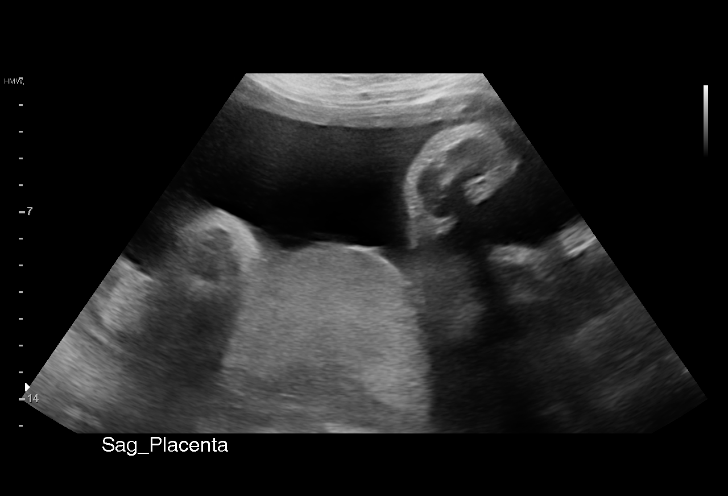
[im 19/30]
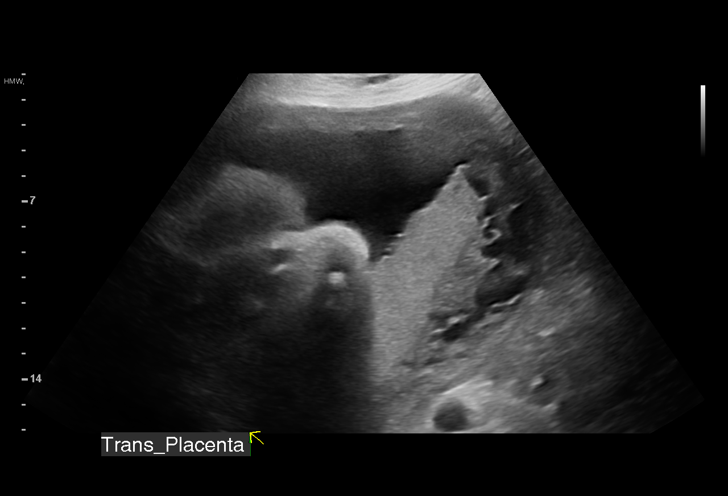
[im 21/30]
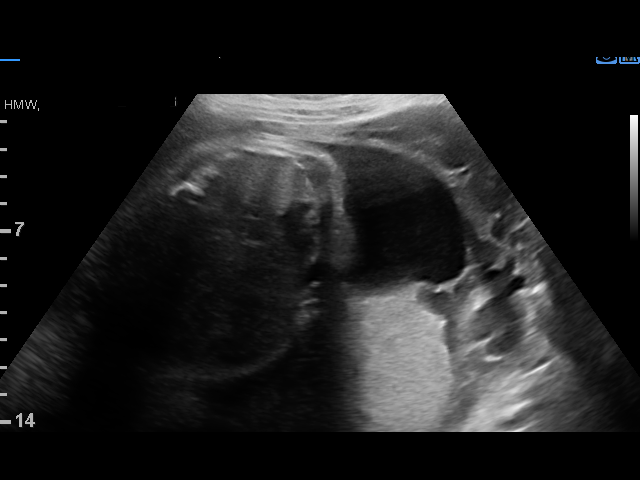
[im 23/30]
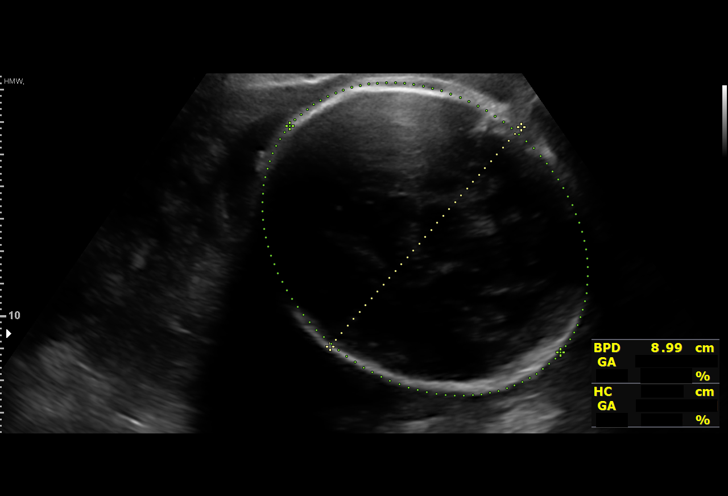
[im 25/30]
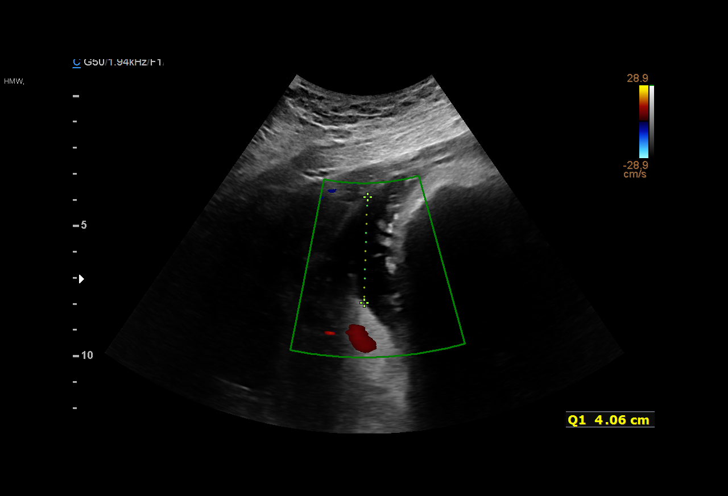
[im 27/30]
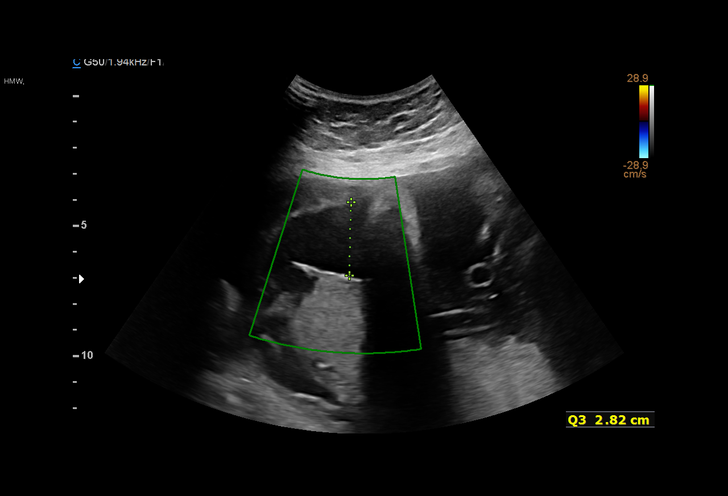
[im 30/30]
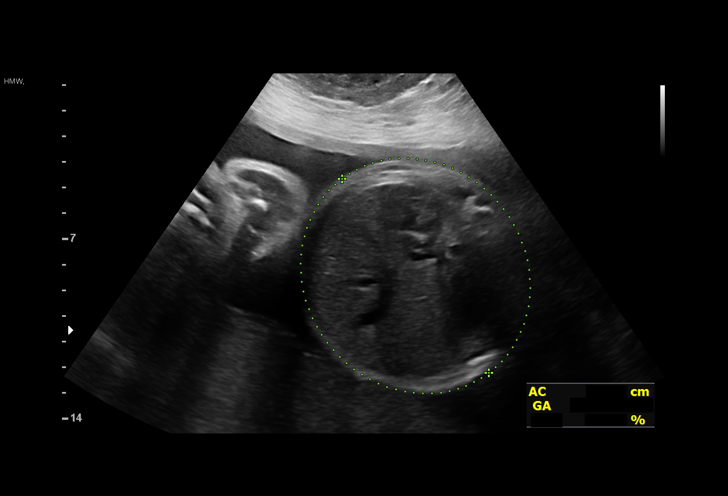

[14 of 28 positions shown; findings below may reference images not displayed]

[H[facility]PITAL],
                                                            Inc[facility]

                                                       [facility]
 ----------------------------------------------------------------------

 ----------------------------------------------------------------------
Indications

  33 weeks gestation of pregnancy
  Maternal morbid obesity BMI 46
  Previous cesarean delivery, antepartum
  Encounter for other antenatal screening
  follow-up
 ----------------------------------------------------------------------
Fetal Evaluation

 Num Of Fetuses:         1
 Fetal Heart Rate(bpm):  133
 Cardiac Activity:       Observed
 Placenta:               Posterior

 Amniotic Fluid
 AFI FV:      Within normal limits

 AFI Sum(cm)     %Tile       Largest Pocket(cm)
 14.32           50

 RUQ(cm)       RLQ(cm)       LUQ(cm)        LLQ(cm)

Biometry

 BPD:        87  mm     G. Age:  35w 1d         88  %    CI:        81.35   %    70 - 86
                                                         FL/HC:      18.5   %    19.9 -
 HC:      304.5  mm     G. Age:  33w 6d         25  %    HC/AC:      1.07        0.96 -
 AC:       284   mm     G. Age:  32w 3d         24  %    FL/BPD:     64.7   %    71 - 87
 FL:       56.3  mm     G. Age:  29w 4d        < 3  %    FL/AC:      19.8   %    20 - 24
 HUM:      50.8  mm     G. Age:  29w 5d        < 5  %

 Est. FW:    8077  gm      4 lb 2 oz     31  %
OB History

 Gravidity:    3         Term:   2
 Living:       2
Gestational Age

 LMP:           33w 3d        Date:  06/23/18                 EDD:   03/30/19
 U/S Today:     32w 5d                                        EDD:   04/04/19
 Best:          33w 3d     Det. By:  LMP  (06/23/18)          EDD:   03/30/19
Anatomy

 Cranium:               Appears normal         Aortic Arch:            Previously seen
 Cavum:                 Previously seen        Ductal Arch:            Previously seen
 Ventricles:            Previously seen        Diaphragm:              Previously seen
 Choroid Plexus:        Bil choroid plexus     Stomach:                Appears normal, left
                        cysts resolved
                                                                       sided
 Cerebellum:            Previously seen        Abdomen:                Previously seen
 Posterior Fossa:       Previously seen        Abdominal Wall:         Previously seen
 Nuchal Fold:           Previously seen        Cord Vessels:           Previously seen
 Face:                  Orbits and profile     Kidneys:                Appear normal
                        previously seen
 Lips:                  Previously seen        Bladder:                Appears normal
 Palate:                Previously seen        Spine:                  Previously seen
 Heart:                 Previously seen        Upper Extremities:      Previously seen
 RVOT:                  Previously seen        Lower Extremities:      Previously seen
 LVOT:                  Previously seen

 Other:  Parents do not wish to know sex of fetus. Heels and 5th digit
         visualized previously. Nasal bone visualized previously.  Technically
         difficult due to maternal habitus and fetal position.
Cervix Uterus Adnexa

 Cervix
 Not visualized (advanced GA >68wks)
Impression

 Normal interval growth.
 CPC resolved.

 BMI> 40
Recommendations

 Follow up growth in 4 weeks.

## 2020-09-15 IMAGING — US US MFM OB FOLLOW UP
1 series · 14 of 28 positions shown · non-contrast
Comparison: none

[Series 1: us mfm ob follow up · 52 acquisitions, 14 frames shown]
[im 2/52]
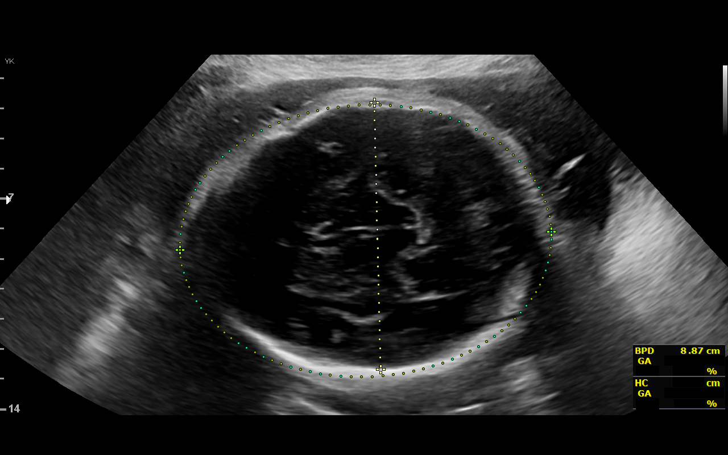
[im 6/52]
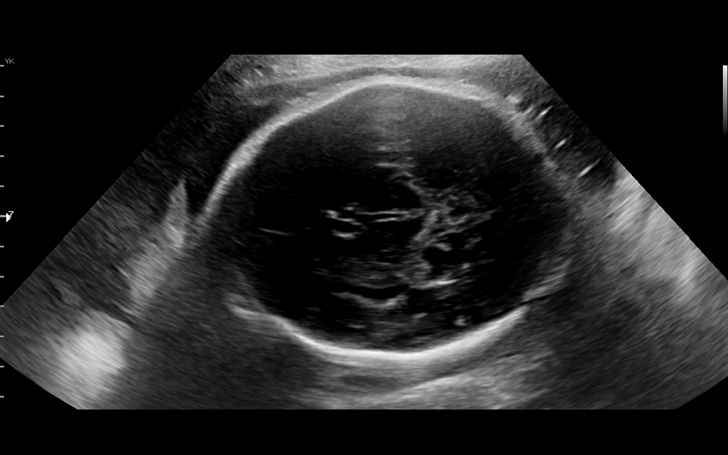
[im 10/52]
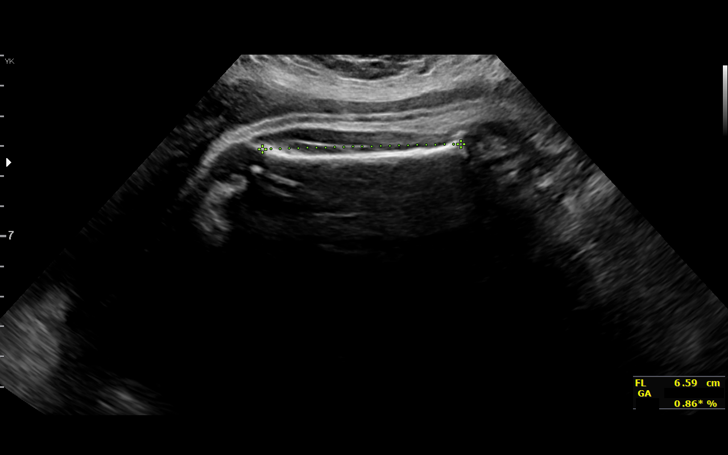
[im 14/52]
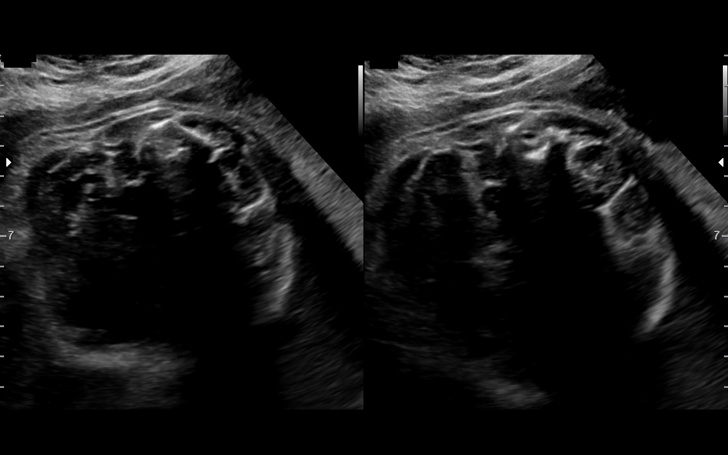
[im 18/52]
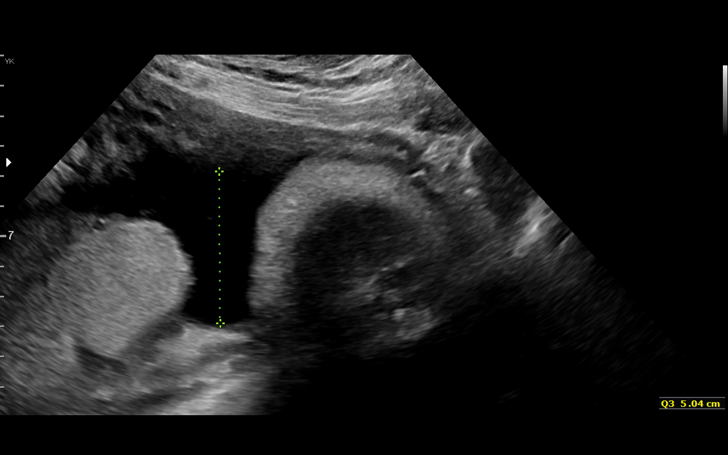
[im 21/52]
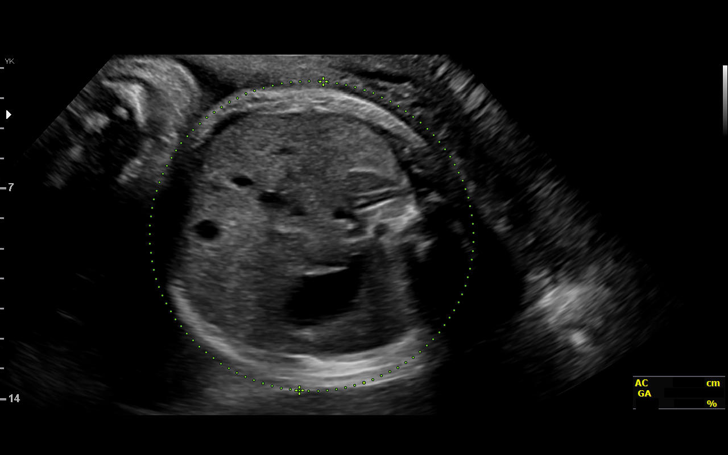
[im 25/52]
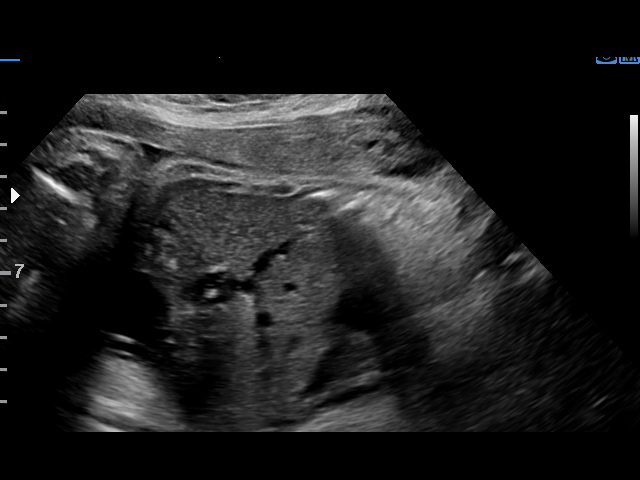
[im 29/52]
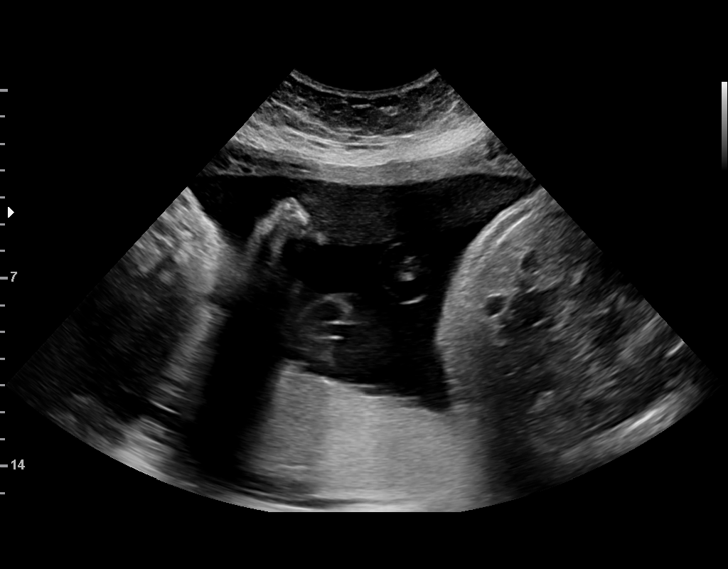
[im 33/52]
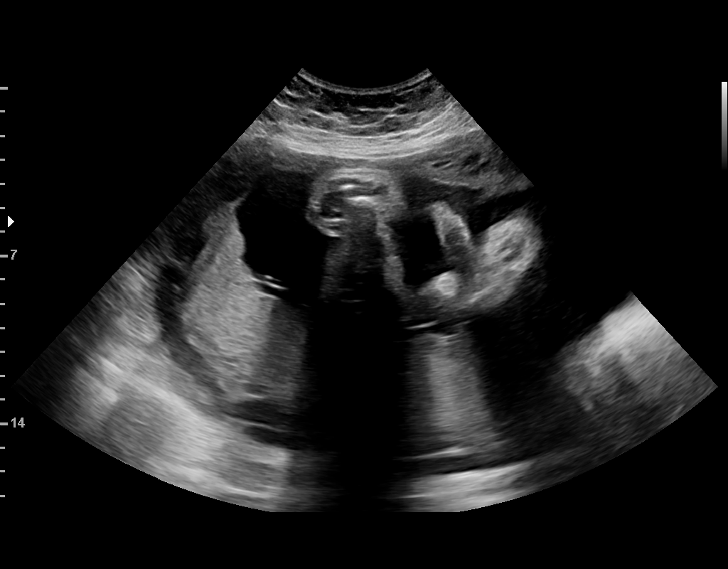
[im 36/52]
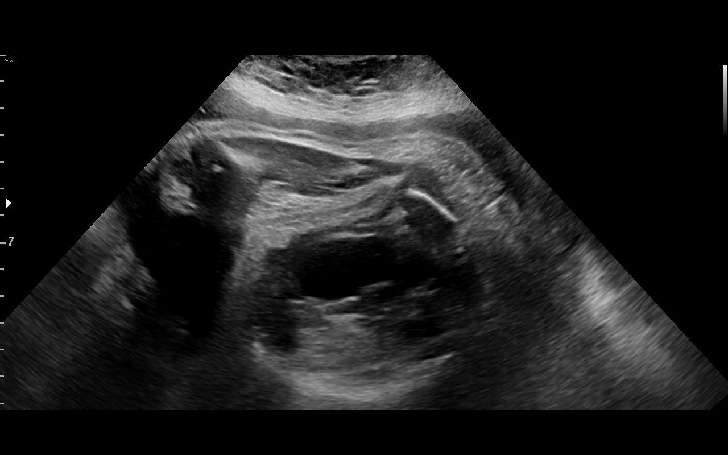
[im 40/52]
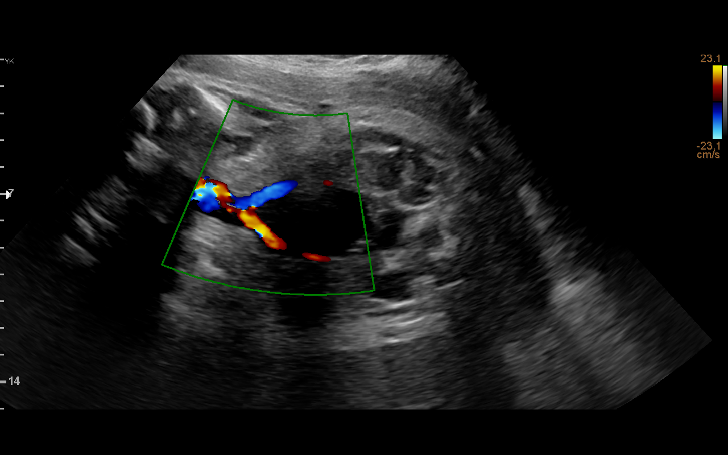
[im 44/52]
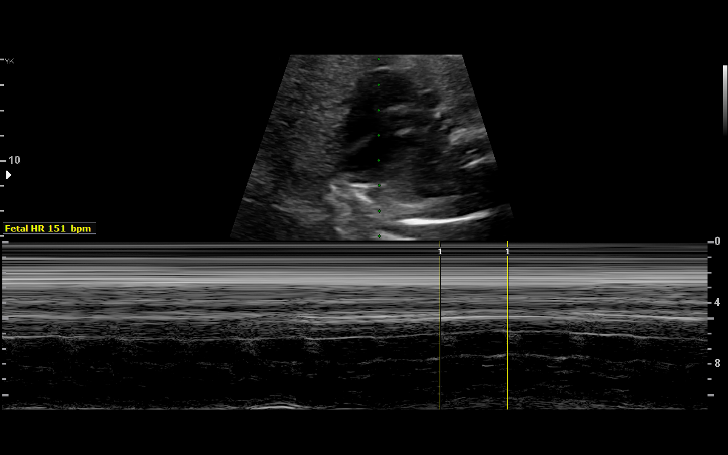
[im 48/52]
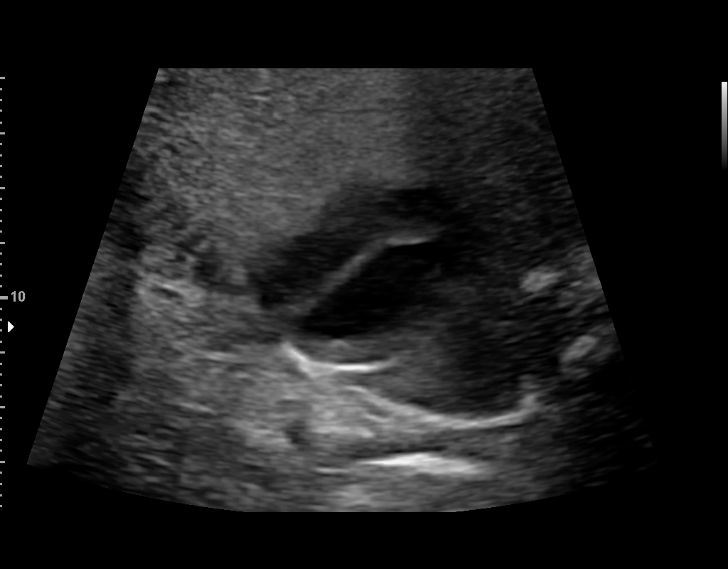
[im 52/52]
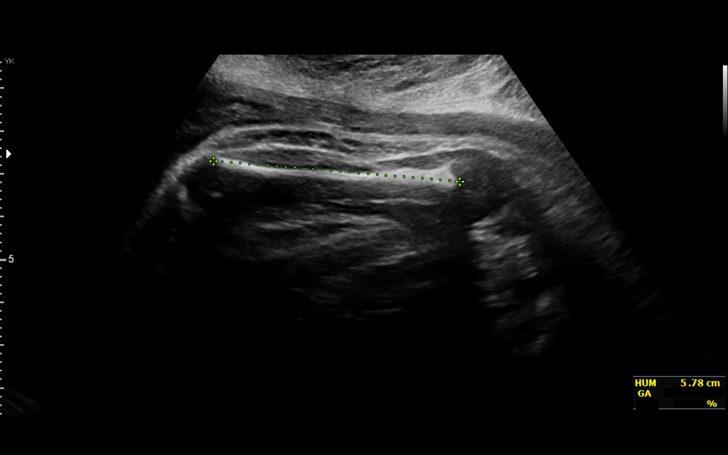

[14 of 28 positions shown; findings below may reference images not displayed]

[[facility]PITAL[facility]
                                                            [facility]

                                                       [facility]
 ----------------------------------------------------------------------

 ----------------------------------------------------------------------
Indications

  Maternal morbid obesity BMI 46
  Previous cesarean delivery, antepartum
  37 weeks gestation of pregnancy
 ----------------------------------------------------------------------
Fetal Evaluation

 Num Of Fetuses:         1
 Fetal Heart Rate(bpm):  151
 Cardiac Activity:       Observed
 Presentation:           Cephalic
 Placenta:               Posterior
 P. Cord Insertion:      Previously Visualized

 Amniotic Fluid
 AFI FV:      Within normal limits

 AFI Sum(cm)     %Tile       Largest Pocket(cm)
 19.06           74

 RUQ(cm)       RLQ(cm)       LUQ(cm)        LLQ(cm)

Biometry

 BPD:      90.1  mm     G. Age:  36w 4d         42  %    CI:        72.12   %    70 - 86
                                                         FL/HC:      18.8   %    20.8 -
 HC:      337.6  mm     G. Age:  38w 5d         58  %    HC/AC:      1.01        0.92 -
 AC:      334.8  mm     G. Age:  37w 3d         64  %    FL/BPD:     70.4   %    71 - 87
 FL:       63.4  mm     G. Age:  32w 6d        < 3  %    FL/AC:      18.9   %    20 - 24
 HUM:      57.3  mm     G. Age:  33w 2d        < 5  %

 Est. FW:    1533  gm      6 lb 6 oz     47  %
OB History

 Gravidity:    3         Term:   2
 Living:       2
Gestational Age

 LMP:           37w 3d        Date:  06/23/18                 EDD:   03/30/19
 U/S Today:     36w 3d                                        EDD:   04/06/19
 Best:          37w 3d     Det. By:  LMP  (06/23/18)          EDD:   03/30/19
Anatomy

 Cranium:               Appears normal         Aortic Arch:            Previously seen
 Cavum:                 Appears normal         Ductal Arch:            Previously seen
 Ventricles:            Appears normal         Diaphragm:              Previously seen
 Choroid Plexus:        Appears normal         Stomach:                Appears normal, left
                                                                       sided
 Cerebellum:            Previously seen        Abdomen:                Appears normal
 Posterior Fossa:       Previously seen        Abdominal Wall:         Previously seen
 Nuchal Fold:           Not applicable (>20    Cord Vessels:           Appears normal (3
                        wks GA)                                        vessel cord)
 Face:                  Profile previously     Kidneys:                Appear normal
                        seen
 Lips:                  Appears normal         Bladder:                Appears normal
 Thoracic:              Previously seen        Spine:                  Previously seen
 Heart:                 Previously seen        Upper Extremities:      Previously seen
 RVOT:                  Previously seen        Lower Extremities:      Previously seen
 LVOT:                  Previously seen

 Other:  Fetus appears to be female. Nasal bone previously seen. Heels and
         5th digit previously seen. Technicallly difficult due to advanced GA
         and maternal habitus.
Impression

 Normal interval growth.
Recommendations

 Follow up as clinically indicated.
 Consider delivery by 40 weeks.

## 2022-04-26 DIAGNOSIS — N879 Dysplasia of cervix uteri, unspecified: Secondary | ICD-10-CM | POA: Insufficient documentation

## 2022-04-26 DIAGNOSIS — R87619 Unspecified abnormal cytological findings in specimens from cervix uteri: Secondary | ICD-10-CM | POA: Insufficient documentation

## 2022-04-26 DIAGNOSIS — R87612 Low grade squamous intraepithelial lesion on cytologic smear of cervix (LGSIL): Secondary | ICD-10-CM | POA: Insufficient documentation

## 2022-05-07 LAB — GLUCOSE, POCT (MANUAL RESULT ENTRY): POC Glucose: 116 mg/dl — AB (ref 70–99)

## 2022-09-06 ENCOUNTER — Ambulatory Visit (HOSPITAL_COMMUNITY): Payer: Self-pay

## 2022-11-09 ENCOUNTER — Telehealth: Payer: Medicaid Other | Admitting: Nurse Practitioner

## 2022-11-09 DIAGNOSIS — H1031 Unspecified acute conjunctivitis, right eye: Secondary | ICD-10-CM | POA: Diagnosis not present

## 2022-11-09 MED ORDER — OFLOXACIN 0.3 % OP SOLN: 1.0000 [drp] | Freq: Four times a day (QID) | OPHTHALMIC | 0 refills | Status: AC

## 2022-11-09 NOTE — Progress Notes (Signed)
Virtual Visit Consent   [name], you are scheduled for a virtual visit with a [facility] provider today. Just as with appointments in the office, your consent must be obtained to participate. Your consent will be active for this visit and any virtual visit you may have with one of our providers in the next 365 days. If you have a MyChart account, a copy of this consent can be sent to you electronically.  As this is a virtual visit, video technology does not allow for your provider to perform a traditional examination. This may limit your provider's ability to fully assess your condition. If your provider identifies any concerns that need to be evaluated in person or the need to arrange testing (such as labs, EKG, etc.), we will make arrangements to do so. Although advances in technology are sophisticated, we cannot ensure that it will always work on either your end or our end. If the connection with a video visit is poor, the visit may have to be switched to a telephone visit. With either a video or telephone visit, we are not always able to ensure that we have a secure connection.  By engaging in this virtual visit, you consent to the provision of healthcare and authorize for your insurance to be billed (if applicable) for the services provided during this visit. Depending on your insurance coverage, you may receive a charge related to this service.  I need to obtain your verbal consent now. Are you willing to proceed with your visit today? [name] has provided verbal consent on 11/09/2022 for a virtual visit (video or telephone). Apolonio Schneiders, FNP  Date: 11/09/2022 7:42 AM  Virtual Visit via Video Note   I, Apolonio Schneiders, connected with  [name]  ([id number], Apr 24, 1986) on 11/09/22 at  8:00 AM EST by a video-enabled telemedicine application and verified that I am speaking with the correct person using two identifiers.  Location: Patient: Virtual Visit Location Patient:  Home Provider: Virtual Visit Location Provider: Home Office   I discussed the limitations of evaluation and management by telemedicine and the availability of in person appointments. The patient expressed understanding and agreed to proceed.    History of Present Illness: [name] is a 37 y.o. who identifies as a female who was assigned female at birth, and is being seen today for pink eye.  Last night after work she noted soreness on her right eyebrow. This morning her right eye was crusted shut and her upper lid was swollen.   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 that had pink eye    Problems:  Patient Active Problem List   Diagnosis Date Noted   Abnormal cytology smear of cervix 04/26/2022   Dysplasia of cervix 04/26/2022   Low grade squamous intraepithelial lesion (LGSIL) on cervicovaginal cytologic smear 04/26/2022   Low grade squamous intraepithelial lesion (LGSIL) on Papanicolaou smear of cervix 04/26/2022   Indication for care in labor or delivery 03/27/2019   VBAC, delivered, current hospitalization 03/27/2019   Uterine scar from previous surgery affecting pregnancy 08/28/2018   OBESITY 06/04/2007    Allergies: No Known Allergies  Medications:  Current Outpatient Medications:    clotrimazole-betamethasone (LOTRISONE) cream, Apply 1 application topically 2 (two) times daily., Disp: 60 g, Rfl: 3   fluticasone (FLONASE) 50 MCG/ACT nasal spray, Place 2 sprays into both nostrils  daily., Disp: 16 g, Rfl: 0  Observations/Objective: Patient is well-developed, well-nourished in no acute distress.  Resting comfortably  at home.  Head is normocephalic, atraumatic.  No labored breathing.  Speech is clear and coherent with logical content.  Patient is alert and oriented at baseline.  Right upper lid with swelling, conjunctiva injected on the right no visible drainage or  discharge   Assessment and Plan: 1. Acute bacterial conjunctivitis of right eye  - ofloxacin (OCUFLOX) 0.3 % ophthalmic solution; Place 1 drop into the right eye 4 (four) times daily for 7 days.  Dispense: 5 mL; Refill: 0    Stay out of work for 24 hours.  Hand washing   Follow Up Instructions: I discussed the assessment and treatment plan with the patient. The patient was provided an opportunity to ask questions and all were answered. The patient agreed with the plan and demonstrated an understanding of the instructions.  A copy of instructions were sent to the patient via MyChart unless otherwise noted below.    The patient was advised to call back or seek an in-person evaluation if the symptoms worsen or if the condition fails to improve as anticipated.  Time:  I spent 10 minutes with the patient via telehealth technology discussing the above problems/concerns.    Apolonio Schneiders, FNP

## 2023-03-22 ENCOUNTER — Encounter (HOSPITAL_COMMUNITY): Payer: Self-pay

## 2023-03-22 ENCOUNTER — Ambulatory Visit: Payer: BLUE CROSS/BLUE SHIELD

## 2023-03-22 ENCOUNTER — Ambulatory Visit: Payer: Self-pay

## 2023-03-22 ENCOUNTER — Ambulatory Visit (HOSPITAL_COMMUNITY)
Admission: EM | Admit: 2023-03-22 | Discharge: 2023-03-22 | Disposition: A | Discharge status: Home / Self Care | Payer: BLUE CROSS/BLUE SHIELD | Attending: Emergency Medicine | Admitting: Emergency Medicine

## 2023-03-22 DIAGNOSIS — Z113 Encounter for screening for infections with a predominantly sexual mode of transmission: Secondary | ICD-10-CM

## 2023-03-22 LAB — HIV ANTIBODY (ROUTINE TESTING W REFLEX): HIV Screen 4th Generation wRfx: NONREACTIVE

## 2023-03-22 NOTE — ED Triage Notes (Signed)
Pt is here for STI-testing. Pt partner tested positive for GC/CH. Denies any symptoms at this time.

## 2023-03-22 NOTE — Discharge Instructions (Addendum)
Today we have screened you for sexually transmitted infections.  We will contact you if anything results is abnormal, to initiate the appropriate treatment.  Please abstain from intercourse until all results are received.  Return to clinic for any new or concerning symptoms.

## 2023-03-22 NOTE — ED Provider Notes (Signed)
[facility]    CSN: [id number] Arrival date & time: 03/22/23  1832      History   Chief Complaint Chief Complaint  Patient presents with   SEXUALLY TRANSMITTED DISEASE    HPI [name] is a 37 y.o. female.   Patient presents to clinic requesting sexually-transmitted infection screening.  Her sexual partner recently got tested and treated for gonorrhea and chlamydia.  She is uncertain if he actually tested positive for these things.  She denies any vaginal discharge, odors or dysuria.  Denies any sores or lesions.  Would like HIV and syphilis screening.    The history is provided by the patient and medical records.    Past Medical History:  Diagnosis Date   Medical history non-contributory    Vaginal Pap smear, abnormal     Patient Active Problem List   Diagnosis Date Noted   Abnormal cytology smear of cervix 04/26/2022   Dysplasia of cervix 04/26/2022   Low grade squamous intraepithelial lesion (LGSIL) on cervicovaginal cytologic smear 04/26/2022   Low grade squamous intraepithelial lesion (LGSIL) on Papanicolaou smear of cervix 04/26/2022   Indication for care in labor or delivery 03/27/2019   VBAC, delivered, current hospitalization 03/27/2019   Uterine scar from previous surgery affecting pregnancy 08/28/2018   OBESITY 06/04/2007    Past Surgical History:  Procedure Laterality Date   CESAREAN SECTION     LAPAROSCOPIC BILATERAL SALPINGECTOMY Bilateral 07/15/2019   Procedure: LAPAROSCOPIC BILATERAL SALPINGECTOMY;  Surgeon: Lavina Hamman, MD;  Location: [facility];  Service: Gynecology;  Laterality: Bilateral;   WISDOM TOOTH EXTRACTION      OB History     Gravida  3   Para  3   Term  3   Preterm      AB      Living  3      SAB  0   IAB  0   Ectopic  0   Multiple  0   Live Births  3            Home Medications    Prior to Admission medications   Medication Sig Start Date End Date Taking?  Authorizing Provider  clotrimazole-betamethasone (LOTRISONE) cream Apply 1 application topically 2 (two) times daily. 12/30/19   Felecia Shelling, DPM  fluticasone (FLONASE) 50 MCG/ACT nasal spray Place 2 sprays into both nostrils daily. 12/06/18   Domenick Gong, MD    Family History Family History  Problem Relation Age of Onset   Diabetes Maternal Grandmother     Social History Social History   Tobacco Use   Smoking status: Never   Smokeless tobacco: Never  Vaping Use   Vaping Use: Never used  Substance Use Topics   Alcohol use: No   Drug use: No     Allergies   Patient has no known allergies.   Review of Systems Review of Systems  Genitourinary:  Negative for dysuria, flank pain, genital sores, menstrual problem, vaginal bleeding and vaginal discharge.     Physical Exam Triage Vital Signs ED Triage Vitals [03/22/23 1900]  Enc Vitals Group     BP (!) 155/75     Pulse Rate 83     Resp 18     Temp 98.2 F (36.8 C)     Temp Source Oral     SpO2 98 %     Weight      Height      Head Circumference  Peak Flow      Pain Score      Pain Loc      Pain Edu?      Excl. in GC?    No data found.  Updated Vital Signs BP (!) 155/75 (BP Location: Left Arm)   Pulse 83   Temp 98.2 F (36.8 C) (Oral)   Resp 18   LMP 03/21/2023   SpO2 98%   Visual Acuity Right Eye Distance:   Left Eye Distance:   Bilateral Distance:    Right Eye Near:   Left Eye Near:    Bilateral Near:     Physical Exam Vitals and nursing note reviewed.  Constitutional:      Appearance: Normal appearance.  HENT:     Head: Normocephalic and atraumatic.     Right Ear: External ear normal.     Left Ear: External ear normal.     Nose: Nose normal.     Mouth/Throat:     Mouth: Mucous membranes are moist.  Eyes:     Conjunctiva/sclera: Conjunctivae normal.  Cardiovascular:     Rate and Rhythm: Normal rate.  Pulmonary:     Effort: Pulmonary effort is normal. No respiratory  distress.  Neurological:     General: No focal deficit present.     Mental Status: She is alert.  Psychiatric:        Mood and Affect: Mood normal.      UC Treatments / Results  Labs (all labs ordered are listed, but only abnormal results are displayed) Labs Reviewed  RPR  HIV ANTIBODY (ROUTINE TESTING W REFLEX)  CERVICOVAGINAL ANCILLARY ONLY    EKG   Radiology No results found.  Procedures Procedures (including critical care time)  Medications Ordered in UC Medications - No data to display  Initial Impression / Assessment and Plan / UC Course  I have reviewed the triage vital signs and the nursing notes.  Pertinent labs & imaging results that were available during my care of the patient were reviewed by me and considered in my medical decision making (see chart for details).  Vitals and triage reviewed, patient is hemodynamically stable.  Partner recently got treated for gonorrhea and chlamydia.  Screening for sexually transmitted infections obtained, will withhold treatment at this time, as patient does not know if he tested positive for STIs and she is asymptomatic.  Will contact patient if anything results is positive to initiate appropriate treatment.  Plan of care, follow-up care and return precautions given, no questions at this time.    Final Clinical Impressions(s) / UC Diagnoses   Final diagnoses:  Screening examination for sexually transmitted disease     Discharge Instructions      Today we have screened you for sexually transmitted infections.  We will contact you if anything results is abnormal, to initiate the appropriate treatment.  Please abstain from intercourse until all results are received.  Return to clinic for any new or concerning symptoms.     ED Prescriptions   None    PDMP not reviewed this encounter.   Erykah Lippert, [place], [place] 03/22/23 [phone]

## 2023-03-23 LAB — CERVICOVAGINAL ANCILLARY ONLY
Bacterial Vaginitis (gardnerella): POSITIVE — AB
Candida Glabrata: NEGATIVE
Candida Vaginitis: POSITIVE — AB
Chlamydia: POSITIVE — AB
Comment: NEGATIVE
Comment: NEGATIVE
Comment: NEGATIVE
Comment: NEGATIVE
Comment: NEGATIVE
Comment: NORMAL
Neisseria Gonorrhea: POSITIVE — AB
Trichomonas: NEGATIVE

## 2023-03-23 LAB — RPR: RPR Ser Ql: NONREACTIVE

## 2023-03-24 ENCOUNTER — Encounter: Payer: Self-pay | Admitting: Physician Assistant

## 2023-03-24 ENCOUNTER — Ambulatory Visit (HOSPITAL_COMMUNITY): Payer: BLUE CROSS/BLUE SHIELD

## 2023-03-24 ENCOUNTER — Telehealth (HOSPITAL_COMMUNITY): Payer: Self-pay | Admitting: Emergency Medicine

## 2023-03-24 ENCOUNTER — Telehealth: Payer: BLUE CROSS/BLUE SHIELD | Admitting: Physician Assistant

## 2023-03-24 DIAGNOSIS — A64 Unspecified sexually transmitted disease: Secondary | ICD-10-CM

## 2023-03-24 MED ORDER — METRONIDAZOLE 0.75 % VA GEL: 1.0000 | Freq: Every day | VAGINAL | 0 refills | Status: AC

## 2023-03-24 MED ORDER — DOXYCYCLINE HYCLATE 100 MG PO CAPS: 100.0000 mg | ORAL_CAPSULE | Freq: Two times a day (BID) | ORAL | 0 refills | Status: AC

## 2023-03-24 MED ORDER — FLUCONAZOLE 150 MG PO TABS: 150.0000 mg | ORAL_TABLET | Freq: Once | ORAL | 0 refills | Status: AC

## 2023-03-24 NOTE — Progress Notes (Signed)
  Patient just had general questions about the medications prescribed for recent STI diagnoses and medications. Will no charge.

## 2023-03-24 NOTE — Telephone Encounter (Signed)
Per protocol, patient will need treatment with IM Rocephin 500mg  for positive Gonorrhea. Will also need treatment with Doxycycline, Metrogel and Diflucan.  Results seen on mychart Prescription sent to pharmacy on file

## 2023-03-29 ENCOUNTER — Ambulatory Visit
Admission: EM | Admit: 2023-03-29 | Discharge: 2023-03-29 | Disposition: A | Discharge status: Home / Self Care | Payer: BLUE CROSS/BLUE SHIELD | Attending: Nurse Practitioner | Admitting: Nurse Practitioner

## 2023-03-29 DIAGNOSIS — A64 Unspecified sexually transmitted disease: Secondary | ICD-10-CM | POA: Diagnosis not present

## 2023-03-29 MED ORDER — CEFTRIAXONE SODIUM 500 MG IJ SOLR
500.0000 mg | INTRAMUSCULAR | Status: DC
  Administered 2023-03-29: 500 mg via INTRAMUSCULAR

## 2023-03-29 NOTE — ED Triage Notes (Signed)
Pt presents for STD treatment.  °

## 2023-06-05 ENCOUNTER — Encounter: Payer: Self-pay | Admitting: Physician Assistant

## 2023-08-24 ENCOUNTER — Encounter (HOSPITAL_COMMUNITY): Payer: Self-pay | Admitting: Emergency Medicine

## 2023-08-24 ENCOUNTER — Ambulatory Visit (HOSPITAL_COMMUNITY)
Admission: EM | Admit: 2023-08-24 | Discharge: 2023-08-24 | Disposition: A | Discharge status: Home / Self Care | Payer: BLUE CROSS/BLUE SHIELD | Attending: Family Medicine | Admitting: Family Medicine

## 2023-08-24 ENCOUNTER — Other Ambulatory Visit: Payer: Self-pay

## 2023-08-24 DIAGNOSIS — J069 Acute upper respiratory infection, unspecified: Secondary | ICD-10-CM | POA: Insufficient documentation

## 2023-08-24 LAB — POCT RAPID STREP A (OFFICE): Rapid Strep A Screen: NEGATIVE

## 2023-08-24 LAB — SARS CORONAVIRUS 2 (TAT 6-24 HRS): SARS Coronavirus 2: NEGATIVE

## 2023-08-24 NOTE — ED Provider Notes (Signed)
[facility]    CSN: [id number] Arrival date & time: 08/24/23  8657      History   Chief Complaint No chief complaint on file.   HPI [name] is a 37 y.o. female.   [name] is a 37 year old female who presents with sinus congestion for several days and sore throat starting this morning that has not improved to this point.  She has not had any fever, chills, wheezing, cough, sinus pressure or pain, or purulent drainage from her nose.  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  She denies any nausea, vomiting, diarrhea.  The history is provided by the patient.    Past Medical History:  Diagnosis Date   Medical history non-contributory    Vaginal Pap smear, abnormal     Patient Active Problem List   Diagnosis Date Noted   Abnormal cytology smear of cervix 04/26/2022   Dysplasia of cervix 04/26/2022   Low grade squamous intraepithelial lesion (LGSIL) on cervicovaginal cytologic smear 04/26/2022   Low grade squamous intraepithelial lesion (LGSIL) on Papanicolaou smear of cervix 04/26/2022   Indication for care in labor or delivery 03/27/2019   VBAC, delivered, current hospitalization 03/27/2019   Uterine scar from previous surgery affecting pregnancy 08/28/2018   OBESITY 06/04/2007    Past Surgical History:  Procedure Laterality Date   CESAREAN SECTION     LAPAROSCOPIC BILATERAL SALPINGECTOMY Bilateral 07/15/2019   Procedure: LAPAROSCOPIC BILATERAL SALPINGECTOMY;  Surgeon: Lavina Hamman, MD;  Location: [facility];  Service: Gynecology;  Laterality: Bilateral;   WISDOM TOOTH EXTRACTION      OB History     Gravida  3   Para  3   Term  3   Preterm      AB      Living  3      SAB  0   IAB  0   Ectopic  0   Multiple  0   Live Births  3            Home Medications    Prior to Admission medications   Medication Sig Start Date  End Date Taking? Authorizing Provider  clotrimazole-betamethasone (LOTRISONE) cream Apply 1 application topically 2 (two) times daily. 12/30/19   Felecia Shelling, DPM  fluticasone (FLONASE) 50 MCG/ACT nasal spray Place 2 sprays into both nostrils daily. 12/06/18   Domenick Gong, MD    Family History Family History  Problem Relation Age of Onset   Diabetes Maternal Grandmother     Social History Social History   Tobacco Use   Smoking status: Never   Smokeless tobacco: Never  Vaping Use   Vaping status: Never Used  Substance Use Topics   Alcohol use: No   Drug use: No     Allergies   Patient has no known allergies.   Review of Systems Review of Systems  Constitutional:  Negative for activity change, chills, fatigue and fever.  HENT:  Positive for congestion, postnasal drip and sore throat. Negative for ear pain, facial swelling, sinus pressure, sinus pain, tinnitus, trouble swallowing and voice change. Sneezing: no difficulty swallowing or breathing.  Eyes:  Negative for photophobia and discharge.  Respiratory:  Negative for cough, chest tightness, shortness of breath and wheezing.   Cardiovascular:  Negative for chest pain and palpitations.  Gastrointestinal:  Negative for diarrhea, nausea and vomiting.  Musculoskeletal:  Negative for  arthralgias, neck pain and neck stiffness.  Skin:  Negative for rash.     Physical Exam Triage Vital Signs ED Triage Vitals  Encounter Vitals Group     BP 08/24/23 0838 138/89     Systolic BP Percentile --      Diastolic BP Percentile --      Pulse Rate 08/24/23 0838 77     Resp 08/24/23 0838 14     Temp 08/24/23 0838 98.8 F (37.1 C)     Temp Source 08/24/23 0838 Oral     SpO2 08/24/23 0838 96 %     Weight --      Height --      Head Circumference --      Peak Flow --      Pain Score 08/24/23 0837 7     Pain Loc --      Pain Education --      Exclude from Growth Chart --    No data found.  Updated Vital Signs BP 138/89  (BP Location: Right Arm)   Pulse 77   Temp 98.8 F (37.1 C) (Oral)   Resp 14   SpO2 96%   Visual Acuity Right Eye Distance:   Left Eye Distance:   Bilateral Distance:    Right Eye Near:   Left Eye Near:    Bilateral Near:     Physical Exam Vitals reviewed.  Constitutional:      General: She is not in acute distress.    Appearance: Normal appearance. She is not ill-appearing, toxic-appearing or diaphoretic.  HENT:     Head: Normocephalic and atraumatic.     Right Ear: Tympanic membrane normal.     Left Ear: Tympanic membrane normal.     Ears:     Comments: Cerumen present and external auditory canals    Nose: Congestion present.     Mouth/Throat:     Mouth: Mucous membranes are moist.     Pharynx: Oropharynx is clear.     Comments: Some moderate swelling of the tonsils.  All structures are midline.  No purulent discharge.  Some clear postnasal drip without cobblestoning. Eyes:     General:        Right eye: No discharge.        Left eye: No discharge.     Extraocular Movements: Extraocular movements intact.     Pupils: Pupils are equal, round, and reactive to light.  Neck:     Comments: Mild, nontender, bilateral submandibular lymphadenitis Cardiovascular:     Rate and Rhythm: Normal rate and regular rhythm.     Pulses: Normal pulses.     Heart sounds: Normal heart sounds. No murmur heard.    No gallop.  Pulmonary:     Effort: Pulmonary effort is normal.     Breath sounds: Normal breath sounds. No wheezing or rales.  Musculoskeletal:     Cervical back: Normal range of motion. No rigidity or tenderness.  Skin:    General: Skin is warm and dry.     Capillary Refill: Capillary refill takes 2 to 3 seconds.  Neurological:     Mental Status: She is alert.      UC Treatments / Results  Labs (all labs ordered are listed, but only abnormal results are displayed) Labs Reviewed  SARS CORONAVIRUS 2 (TAT 6-24 HRS)  POCT RAPID STREP A (OFFICE)     EKG   Radiology No results found.  Procedures Procedures (including critical care time)  Medications Ordered in  UC Medications - No data to display  Initial Impression / Assessment and Plan / UC Course  I have reviewed the triage vital signs and the nursing notes.  Pertinent labs & imaging results that were available during my care of the patient were reviewed by me and considered in my medical decision making (see chart for details).     Viral URI - The patient's symptoms are very mild but she does have some sinus congestion with postnasal drip and sore throat without cough. 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 - COVID are pending.  Will follow-up with results. - Symptomatic management flag symptoms to follow-up on. - She will be okay to return to work today with diligent handwashing and mask precautions. - The patient voiced understanding and agreement with plan.  Final Clinical Impressions(s) / UC Diagnoses   Final diagnoses:  Acute upper respiratory infection     Discharge Instructions      You have an upper respiratory infection. Most cases are due to a virus and do not require antibiotics for treatment.  Make sure to continue Sydnor oral hydration.  You can take tylenol for fever as needed Start Flonase daily for the next week and then as needed. You can use nasal saline spray multiple times daily as well.  You can use a daily antihistamine or guaifenesin as an expectorant but you need to be well hydrated for these medications to work.  Get adequate rest for recovery Maintain distance from others and wear a mask in public areas to avoid spread  If you start to experience shortness of breath, fevers that don't respond to medication, confusion, profound neck stiffness, or fainting, return to the urgent care or ED.       ED Prescriptions   None    PDMP not  reviewed this encounter.   Ivor Messier, MD 08/24/23 [phone]

## 2023-08-24 NOTE — ED Triage Notes (Signed)
Pt states this morning she started having a sore, itchy throat. No other symptoms noted. No known fevers. No known sick contacts.

## 2023-08-24 NOTE — Discharge Instructions (Addendum)
You have an upper respiratory infection. Most cases are due to a virus and do not require antibiotics for treatment.  Make sure to continue Dolin oral hydration.  You can take tylenol for fever as needed Start Flonase daily for the next week and then as needed. You can use nasal saline spray multiple times daily as well.  You can use a daily antihistamine or guaifenesin as an expectorant but you need to be well hydrated for these medications to work.  Get adequate rest for recovery Maintain distance from others and wear a mask in public areas to avoid spread  If you start to experience shortness of breath, fevers that don't respond to medication, confusion, profound neck stiffness, or fainting, return to the urgent care or ED.

## 2023-11-20 ENCOUNTER — Ambulatory Visit
Admission: EM | Admit: 2023-11-20 | Discharge: 2023-11-20 | Disposition: A | Discharge status: Home / Self Care | Payer: BLUE CROSS/BLUE SHIELD | Attending: Family Medicine | Admitting: Family Medicine

## 2023-11-20 DIAGNOSIS — J208 Acute bronchitis due to other specified organisms: Secondary | ICD-10-CM | POA: Insufficient documentation

## 2023-11-20 DIAGNOSIS — J101 Influenza due to other identified influenza virus with other respiratory manifestations: Secondary | ICD-10-CM | POA: Diagnosis present

## 2023-11-20 LAB — POCT INFLUENZA A/B
Influenza A, POC: POSITIVE — AB
Influenza B, POC: NEGATIVE

## 2023-11-20 MED ORDER — PROMETHAZINE-DM 6.25-15 MG/5ML PO SYRP: 5.0000 mL | ORAL_SOLUTION | Freq: Three times a day (TID) | ORAL | 0 refills | Status: DC | PRN

## 2023-11-20 MED ORDER — PREDNISONE 10 MG PO TABS: 30.0000 mg | ORAL_TABLET | Freq: Every day | ORAL | 0 refills | Status: DC

## 2023-11-20 NOTE — ED Provider Notes (Signed)
[facility]  Note:  This document was prepared using Conservation officer, historic buildings and may include unintentional dictation errors.  MRN: [id number] DOB: Sep 06, 1986  Subjective:   [name] is a 38 y.o. female presenting for 1 day history of acute onset cough, sinus and chest congestion, chills, body pains.  Patient had a video visit and was prescribed medications for supportive care for bronchitis.  Would like a flu test.  No asthma.  No smoking of any kind including cigarettes, cigars, vaping, marijuana use.    No current facility-administered medications for this encounter.  Current Outpatient Medications:    clotrimazole-betamethasone (LOTRISONE) cream, Apply 1 application topically 2 (two) times daily., Disp: 60 g, Rfl: 3   fluticasone (FLONASE) 50 MCG/ACT nasal spray, Place 2 sprays into both nostrils daily., Disp: 16 g, Rfl: 0   No Known Allergies  Past Medical History:  Diagnosis Date   Medical history non-contributory    Vaginal Pap smear, abnormal      Past Surgical History:  Procedure Laterality Date   CESAREAN SECTION     LAPAROSCOPIC BILATERAL SALPINGECTOMY Bilateral 07/15/2019   Procedure: LAPAROSCOPIC BILATERAL SALPINGECTOMY;  Surgeon: Lavina Hamman, MD;  Location: [facility];  Service: Gynecology;  Laterality: Bilateral;   WISDOM TOOTH EXTRACTION      Family History  Problem Relation Age of Onset   Diabetes Maternal Grandmother     Social History   Tobacco Use   Smoking status: Never   Smokeless tobacco: Never  Vaping Use   Vaping status: Never Used  Substance Use Topics   Alcohol use: No   Drug use: No    ROS   Objective:   Vitals: BP (!) 150/94 (BP Location: Right Wrist)   Pulse 95   Temp 99.9 F (37.7 C) (Oral)   Resp 20   LMP 11/07/2023   SpO2 97%   Physical Exam Constitutional:      General: She is not in acute distress.    Appearance: Normal appearance. She is well-developed  and normal weight. She is not ill-appearing, toxic-appearing or diaphoretic.  HENT:     Head: Normocephalic and atraumatic.     Right Ear: Tympanic membrane, ear canal and external ear normal. No drainage or tenderness. No middle ear effusion. There is no impacted cerumen. Tympanic membrane is not erythematous or bulging.     Left Ear: Tympanic membrane, ear canal and external ear normal. No drainage or tenderness.  No middle ear effusion. There is no impacted cerumen. Tympanic membrane is not erythematous or bulging.     Nose: Nose normal. No congestion or rhinorrhea.     Mouth/Throat:     Mouth: Mucous membranes are moist. No oral lesions.     Pharynx: No pharyngeal swelling, oropharyngeal exudate, posterior oropharyngeal erythema or uvula swelling.     Tonsils: No tonsillar exudate or tonsillar abscesses.  Eyes:     General: No scleral icterus.       Right eye: No discharge.        Left eye: No discharge.     Extraocular Movements: Extraocular movements intact.     Right eye: Normal extraocular motion.     Left eye: Normal extraocular motion.     Conjunctiva/sclera: Conjunctivae normal.  Cardiovascular:     Rate and Rhythm: Normal rate and regular rhythm.     Heart sounds: Normal heart sounds. No murmur heard.    No friction rub. No gallop.  Pulmonary:  Effort: Pulmonary effort is normal. No respiratory distress.     Breath sounds: No stridor. No wheezing, rhonchi or rales.  Chest:     Chest wall: No tenderness.  Musculoskeletal:     Cervical back: Normal range of motion and neck supple.  Lymphadenopathy:     Cervical: No cervical adenopathy.  Skin:    General: Skin is warm and dry.  Neurological:     General: No focal deficit present.     Mental Status: She is alert and oriented to person, place, and time.  Psychiatric:        Mood and Affect: Mood normal.        Behavior: Behavior normal.     Results for orders placed or performed during the hospital encounter of  11/20/23 (from the past 24 hours)  SARS CORONAVIRUS 2 (TAT 6-24 HRS) Anterior Nasal Swab     Status: None   Collection Time: 11/20/23  4:42 PM   Specimen: Anterior Nasal Swab  Result Value Ref Range   SARS Coronavirus 2 NEGATIVE NEGATIVE  POCT Influenza A/B     Status: Abnormal   Collection Time: 11/20/23  5:08 PM  Result Value Ref Range   Influenza A, POC Positive (A) Negative   Influenza B, POC Negative Negative    Assessment and Plan :   PDMP not reviewed this encounter.  1. Influenza A   2. Viral bronchitis    Recommend continued supportive care, I will add prednisone for the viral bronchitis secondary to influenza.  Counseled patient on potential for adverse effects with medications prescribed/recommended today, ER and return-to-clinic precautions discussed, patient verbalized understanding.    Wallis Bamberg, [place] 11/21/23 [phone]

## 2023-11-20 NOTE — ED Triage Notes (Addendum)
 Pt c/o cough, head/chest congestion, chills-sx started yesterday-denies known fever-taking theraflu-NAD-steady gait-later added she had video appt this dx with bronchits-rx , albuterol inhaler and tessalon perles

## 2023-11-21 LAB — SARS CORONAVIRUS 2 (TAT 6-24 HRS): SARS Coronavirus 2: NEGATIVE

## 2024-02-01 ENCOUNTER — Encounter (HOSPITAL_COMMUNITY): Payer: Self-pay

## 2024-02-01 ENCOUNTER — Ambulatory Visit (HOSPITAL_COMMUNITY): Admission: EM | Admit: 2024-02-01 | Discharge: 2024-02-01 | Disposition: A | Discharge status: Home / Self Care

## 2024-02-01 DIAGNOSIS — Z113 Encounter for screening for infections with a predominantly sexual mode of transmission: Secondary | ICD-10-CM | POA: Diagnosis present

## 2024-02-01 LAB — HIV ANTIBODY (ROUTINE TESTING W REFLEX): HIV Screen 4th Generation wRfx: NONREACTIVE

## 2024-02-01 NOTE — ED Provider Notes (Signed)
 [facility]    CSN: [id number] Arrival date & time: 02/01/24  1806      History   Chief Complaint Chief Complaint  Patient presents with   SEXUALLY TRANSMITTED DISEASE    HPI [name] is a 38 y.o. female.   38 year old female who presents urgent care for screening for STI.  She would like to have swab and blood work done.  She denies any vaginal symptoms, suprapubic pain or any high risk contacts.  She does not have a new partner.  She would just like to be tested to ensure everything looks Angus as she has had a positive result in the past.     Past Medical History:  Diagnosis Date   Medical history non-contributory    Vaginal Pap smear, abnormal     Patient Active Problem List   Diagnosis Date Noted   Abnormal cytology smear of cervix 04/26/2022   Dysplasia of cervix 04/26/2022   Low grade squamous intraepithelial lesion (LGSIL) on cervicovaginal cytologic smear 04/26/2022   Low grade squamous intraepithelial lesion (LGSIL) on Papanicolaou smear of cervix 04/26/2022   Indication for care in labor or delivery 03/27/2019   VBAC, delivered, current hospitalization 03/27/2019   Uterine scar from previous surgery affecting pregnancy 08/28/2018   OBESITY 06/04/2007    Past Surgical History:  Procedure Laterality Date   CESAREAN SECTION     LAPAROSCOPIC BILATERAL SALPINGECTOMY Bilateral 07/15/2019   Procedure: LAPAROSCOPIC BILATERAL SALPINGECTOMY;  Surgeon: Cyd Dowse, MD;  Location: [facility];  Service: Gynecology;  Laterality: Bilateral;   WISDOM TOOTH EXTRACTION      OB History     Gravida  3   Para  3   Term  3   Preterm      AB      Living  3      SAB  0   IAB  0   Ectopic  0   Multiple  0   Live Births  3            Home Medications    Prior to Admission medications   Medication Sig Start Date End Date Taking? Authorizing Provider  levonorgestrel (MIRENA, 52 MG,) 20 MCG/DAY IUD 1 each by  Intrauterine route once. 05/10/19  Yes [provider]  fluticasone  (FLONASE ) 50 MCG/ACT nasal spray Place 2 sprays into both nostrils daily. 12/06/18   Ethlyn Herd, MD    Family History Family History  Problem Relation Age of Onset   Diabetes Maternal Grandmother     Social History Social History   Tobacco Use   Smoking status: Never   Smokeless tobacco: Never  Vaping Use   Vaping status: Never Used  Substance Use Topics   Alcohol use: No   Drug use: No     Allergies   Patient has no known allergies.   Review of Systems Review of Systems  Constitutional:  Negative for chills and fever.  HENT:  Negative for ear pain and sore throat.   Eyes:  Negative for pain and visual disturbance.  Respiratory:  Negative for cough and shortness of breath.   Cardiovascular:  Negative for chest pain and palpitations.  Gastrointestinal:  Negative for abdominal pain and vomiting.  Genitourinary:  Positive for vaginal bleeding, vaginal discharge and vaginal pain. Negative for dysuria and hematuria.  Musculoskeletal:  Negative for arthralgias and back pain.  Skin:  Negative for color change and rash.  Neurological:  Negative for seizures and syncope.  All  other systems reviewed and are negative.    Physical Exam Triage Vital Signs ED Triage Vitals [02/01/24 1923]  Encounter Vitals Group     BP 133/88     Systolic BP Percentile      Diastolic BP Percentile      Pulse Rate 84     Resp 16     Temp 98.2 F (36.8 C)     Temp Source Oral     SpO2 (!) 84 %     Weight      Height      Head Circumference      Peak Flow      Pain Score 0     Pain Loc      Pain Education      Exclude from Growth Chart    No data found.  Updated Vital Signs BP 133/88 (BP Location: Left Arm)   Pulse 84   Temp 98.2 F (36.8 C) (Oral)   Resp 16   LMP 01/24/2024 (Exact Date)   SpO2 (!) 84%   Breastfeeding No   Visual Acuity Right Eye Distance:   Left Eye Distance:   Bilateral  Distance:    Right Eye Near:   Left Eye Near:    Bilateral Near:     Physical Exam Vitals and nursing note reviewed.  Constitutional:      General: She is not in acute distress.    Appearance: She is well-developed.  HENT:     Head: Normocephalic and atraumatic.  Eyes:     Conjunctiva/sclera: Conjunctivae normal.  Cardiovascular:     Rate and Rhythm: Normal rate and regular rhythm.     Heart sounds: No murmur heard. Pulmonary:     Effort: Pulmonary effort is normal. No respiratory distress.     Breath sounds: Normal breath sounds.  Abdominal:     Palpations: Abdomen is soft.     Tenderness: There is no abdominal tenderness.  Musculoskeletal:        General: No swelling.     Cervical back: Neck supple.  Skin:    General: Skin is warm and dry.     Capillary Refill: Capillary refill takes less than 2 seconds.  Neurological:     Mental Status: She is alert.  Psychiatric:        Mood and Affect: Mood normal.      UC Treatments / Results  Labs (all labs ordered are listed, but only abnormal results are displayed) Labs Reviewed - No data to display  EKG   Radiology No results found.  Procedures Procedures (including critical care time)  Medications Ordered in UC Medications - No data to display  Initial Impression / Assessment and Plan / UC Course  I have reviewed the triage vital signs and the nursing notes.  Pertinent labs & imaging results that were available during my care of the patient were reviewed by me and considered in my medical decision making (see chart for details).     Screening examination for STI   Screening swab and blood work done today and results will be available in 24-48 hours. We will contact you if we need to arrange additional treatment based on your testing. Negative results will be on your MyChart account.   Final Clinical Impressions(s) / UC Diagnoses   Final diagnoses:  None   Discharge Instructions   None    ED  Prescriptions   None    PDMP not reviewed this encounter.   Kreg Pesa,  PA-C 02/01/24 1959

## 2024-02-01 NOTE — Discharge Instructions (Addendum)
 Screening swab and blood work done today and results will be available in 24-48 hours. We will contact you if we need to arrange additional treatment based on your testing. Negative results will be on your MyChart account.

## 2024-02-01 NOTE — ED Triage Notes (Signed)
 Patient here today to be tested for Stds. Patient denies symptoms.

## 2024-02-02 ENCOUNTER — Ambulatory Visit (HOSPITAL_COMMUNITY): Payer: Self-pay

## 2024-02-02 LAB — CERVICOVAGINAL ANCILLARY ONLY
Bacterial Vaginitis (gardnerella): POSITIVE — AB
Chlamydia: POSITIVE — AB
Comment: NEGATIVE
Comment: NEGATIVE
Comment: NEGATIVE
Comment: NORMAL
Neisseria Gonorrhea: NEGATIVE
Trichomonas: NEGATIVE

## 2024-02-02 LAB — RPR: RPR Ser Ql: NONREACTIVE

## 2024-02-03 ENCOUNTER — Telehealth (HOSPITAL_COMMUNITY): Payer: Self-pay

## 2024-02-03 ENCOUNTER — Encounter: Payer: Self-pay | Admitting: Physician Assistant

## 2024-02-03 MED ORDER — METRONIDAZOLE 500 MG PO TABS: 500.0000 mg | ORAL_TABLET | Freq: Two times a day (BID) | ORAL | 0 refills | Status: AC

## 2024-02-03 MED ORDER — DOXYCYCLINE HYCLATE 100 MG PO CAPS: 100.0000 mg | ORAL_CAPSULE | Freq: Two times a day (BID) | ORAL | 0 refills | Status: AC

## 2024-02-03 NOTE — Telephone Encounter (Signed)
 Noted. Encounter signed and completed.

## 2024-02-03 NOTE — Telephone Encounter (Signed)
 Informed by front desk staff "Was seen Friday and wanted to know if she had any medication sent out."  Patient was seen 02/01/24 and cytology results are as follows:     Component Ref Range & Units (hover) 2 d ago  Neisseria Gonorrhea Negative  Chlamydia Positive Abnormal   Trichomonas Negative  Bacterial Vaginitis (gardnerella) Positive Abnormal      Message routed to provider on staff in regards to needed treatment.
